# Patient Record
Sex: Female | Born: 1965 | Race: White | Hispanic: No | Marital: Married | Smoking: Former smoker
Health system: Southern US, Community
[De-identification: ages and names within clinical notes are randomized; demographics above are authoritative.]

## PROBLEM LIST (undated history)

## (undated) DIAGNOSIS — M549 Dorsalgia, unspecified: Secondary | ICD-10-CM

## (undated) DIAGNOSIS — F909 Attention-deficit hyperactivity disorder, unspecified type: Secondary | ICD-10-CM

## (undated) DIAGNOSIS — G2581 Restless legs syndrome: Secondary | ICD-10-CM

## (undated) HISTORY — PX: CHOLECYSTECTOMY: SHX55

## (undated) HISTORY — PX: ABLATION: SHX5711

## (undated) HISTORY — PX: NASAL FRACTURE SURGERY: SHX718

## (undated) HISTORY — PX: TUBAL LIGATION: SHX77

---

## 2007-01-27 ENCOUNTER — Ambulatory Visit: Payer: Self-pay | Admitting: Family Medicine

## 2007-01-27 DIAGNOSIS — L2089 Other atopic dermatitis: Secondary | ICD-10-CM | POA: Insufficient documentation

## 2007-01-27 DIAGNOSIS — G4700 Insomnia, unspecified: Secondary | ICD-10-CM | POA: Insufficient documentation

## 2007-01-27 DIAGNOSIS — K219 Gastro-esophageal reflux disease without esophagitis: Secondary | ICD-10-CM | POA: Insufficient documentation

## 2007-01-28 ENCOUNTER — Encounter: Payer: Self-pay | Admitting: Family Medicine

## 2007-02-02 LAB — CONVERTED CEMR LAB
ALT: 15 U/L
AST: 15 U/L
Albumin: 4.6 g/dL
Alkaline Phosphatase: 81 U/L
BUN: 11 mg/dL
CO2: 21 meq/L
Calcium: 9.1 mg/dL
Chloride: 105 meq/L
Cholesterol: 172 mg/dL
Creatinine, Ser: 0.76 mg/dL
Glucose, Bld: 85 mg/dL
HDL: 51 mg/dL
Helicobacter pylori, IgM: <0.89
LDL Cholesterol: 99 mg/dL
Potassium: 4.2 meq/L
Sodium: 139 meq/L
Total Bilirubin: 0.4 mg/dL
Total CHOL/HDL Ratio: 3.4
Total Protein: 7.5 g/dL
Triglycerides: 110 mg/dL
VLDL: 22 mg/dL

## 2007-12-07 ENCOUNTER — Ambulatory Visit: Payer: Self-pay | Admitting: Family

## 2007-12-07 ENCOUNTER — Encounter: Admission: RE | Admit: 2007-12-07 | Discharge: 2007-12-07 | Payer: Self-pay | Admitting: Obstetrics & Gynecology

## 2007-12-09 ENCOUNTER — Encounter: Admission: RE | Admit: 2007-12-09 | Discharge: 2007-12-09 | Payer: Self-pay | Admitting: Obstetrics & Gynecology

## 2007-12-11 ENCOUNTER — Encounter: Admission: RE | Admit: 2007-12-11 | Discharge: 2007-12-11 | Payer: Self-pay | Admitting: Obstetrics & Gynecology

## 2007-12-14 ENCOUNTER — Ambulatory Visit: Payer: Self-pay | Admitting: Family Medicine

## 2007-12-14 DIAGNOSIS — G2581 Restless legs syndrome: Secondary | ICD-10-CM | POA: Insufficient documentation

## 2007-12-14 DIAGNOSIS — L738 Other specified follicular disorders: Secondary | ICD-10-CM | POA: Insufficient documentation

## 2007-12-15 LAB — CONVERTED CEMR LAB
Ferritin: 10 ng/mL (ref 10–291)
Folate: 10.8 ng/mL
Vitamin B-12: 334 pg/mL (ref 211–911)

## 2007-12-16 ENCOUNTER — Ambulatory Visit: Payer: Self-pay | Admitting: Obstetrics & Gynecology

## 2007-12-23 ENCOUNTER — Ambulatory Visit: Payer: Self-pay | Admitting: Obstetrics & Gynecology

## 2008-01-18 ENCOUNTER — Ambulatory Visit: Payer: Self-pay | Admitting: Obstetrics & Gynecology

## 2008-01-18 ENCOUNTER — Encounter: Payer: Self-pay | Admitting: Obstetrics & Gynecology

## 2008-01-18 ENCOUNTER — Ambulatory Visit (HOSPITAL_COMMUNITY): Admission: RE | Admit: 2008-01-18 | Discharge: 2008-01-18 | Payer: Self-pay | Admitting: Obstetrics & Gynecology

## 2008-02-15 ENCOUNTER — Telehealth: Payer: Self-pay | Admitting: Family Medicine

## 2008-02-17 ENCOUNTER — Ambulatory Visit: Payer: Self-pay | Admitting: Obstetrics & Gynecology

## 2008-05-17 ENCOUNTER — Ambulatory Visit: Payer: Self-pay | Admitting: Occupational Medicine

## 2008-05-17 ENCOUNTER — Telehealth (INDEPENDENT_AMBULATORY_CARE_PROVIDER_SITE_OTHER): Payer: Self-pay | Admitting: Occupational Medicine

## 2008-09-14 ENCOUNTER — Ambulatory Visit: Payer: Self-pay | Admitting: Family Medicine

## 2009-03-27 ENCOUNTER — Ambulatory Visit: Payer: Self-pay | Admitting: Family Medicine

## 2009-03-27 DIAGNOSIS — N63 Unspecified lump in unspecified breast: Secondary | ICD-10-CM | POA: Insufficient documentation

## 2009-03-30 ENCOUNTER — Ambulatory Visit: Payer: Self-pay | Admitting: Family Medicine

## 2009-03-30 DIAGNOSIS — F909 Attention-deficit hyperactivity disorder, unspecified type: Secondary | ICD-10-CM | POA: Insufficient documentation

## 2009-04-06 ENCOUNTER — Encounter: Admission: RE | Admit: 2009-04-06 | Discharge: 2009-04-06 | Payer: Self-pay | Admitting: Family Medicine

## 2009-05-03 ENCOUNTER — Telehealth: Payer: Self-pay | Admitting: Family Medicine

## 2009-11-16 ENCOUNTER — Encounter: Admission: RE | Admit: 2009-11-16 | Discharge: 2009-11-16 | Payer: Self-pay | Admitting: Family Medicine

## 2011-01-01 NOTE — Op Note (Signed)
NAMELAVETA, GILKEY                ACCOUNT NO.:  [id number]   MEDICAL RECORD NO.:  [id number]          PATIENT TYPE:  AMB   LOCATION:                                FACILITY:  [facility]   PHYSICIAN:  Lesly Dukes, M.D. DATE OF BIRTH:  [birth year]   DATE OF PROCEDURE:  DATE OF DISCHARGE:                               OPERATIVE REPORT   PREOPERATIVE DIAGNOSES:  A 45 year old para 3-0-0-3 done with  childbearing with dysfunctional uterine bleeding and an endometrial  polyp.   POSTOPERATIVE DIAGNOSES:  A 45 year old para 3-0-0-3 done with  childbearing with dysfunctional uterine bleeding and an endometrial  polyp.   PROCEDURES:  1. Dilation and curettage.  2. Hysteroscopy.  3. Polypectomy.  4. Hydrothermal ablation.   SURGEON:  Lesly Dukes, MD.   ANESTHESIA:  General.   SPECIMENS:  Endometrial curettings to Pathology.   ESTIMATED BLOOD LOSS:  Minimal.   COMPLICATIONS:  None.   DESCRIPTION OF PROCEDURE:  After informed consent was obtained, the  patient was taken to the operating room where general anesthesia was  found to be adequate.  The patient was placed in dorsal lithotomy  position, and prepared and draped in normal sterile fashion.  The  bladder was in and out catheterized.  The uterus was then brought into  view with a bivalve speculum.  The anterior lip of the cervix was  grasped with single-tooth tenaculum.  A cervical os was gently dilated  to a #8 Hegar dilator.  The hysteroscope was introduced into the uterus  and soaked with saline.  There were several polyps and these were  scraped out using a sharp curette.  These curettings were sent to  Pathology.  At this point, the hydrothermal endometrial ablation was  then proceeded according to manufacture's guidelines.  There was a  heating phase and then a 10 minutes' ablation and a 1 minute cool down.  There was no evidence of leakage of fluid from the cervix and all fluid  level stayed stable.  At the end of  the procedure, all  instruments were removed from the patient's vagina and with good  hemostasis in the cervix.  The patient tolerated the procedure well.  Sponge, lap, instrument, and needle counts were correct x2.  The patient  to recovery room in stable condition.      Lesly Dukes, M.D.  Electronically Signed     KHL/MEDQ  D:  01/18/2008  T:  01/19/2008  Job:  [id number]
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
## 2011-01-01 NOTE — Assessment & Plan Note (Signed)
NAMEJEMIMA, Kelli Tyler                ACCOUNT NO.:  [id number]   MEDICAL RECORD NO.:  [id number]          PATIENT TYPE:  POB   LOCATION:  [facility] [place]         FACILITY:  [facility]   PHYSICIAN:  Elsie Lincoln, MD      DATE OF BIRTH:  [birth year]   DATE OF SERVICE:                                  CLINIC NOTE   REFERRING PHYSICIAN:  Elsie Lincoln, M.D.   Patient 45 year old female with dysfunction uterine bleeding.  Patient  had an ultrasound that showed possible adenomyosis.  She then underwent  endometrial biopsy which showed__________ proliferative endometrium and  endometrial polyps.  No hyperplasia or cancer was identified.  We had an  indepth conversation about what a polyp is, what adenomyosis is.  I  doubt she has adenomyosis because she absolutely no pain with her  periods.  So, this might be a polyp causing her bleeding.  We talked  about options and the best one we derived was a D and C hysteroscopy  polypectomy and hydrotherm ablation.  Patient understands she cannot get  pregnant after this procedure.  She has already had a BTL.  The patient  was consented for the procedure.  Risks include but not limited to  bleeding, infection and damage to the back of the uterus.  She also  could have burning at the vagina if the hot water leaked out during the  hydrothermal ablation.  I reviewed her history in depth from her clinic  note on April 20th.  I did notice she has an extensive history of colon  cancer and had a colonoscopy in 2001.  It was so painful that she has  elected not to have another one.  I am referring her to my  gastroenterologist at [facility] and hopefully he will be able  to do a less painful colonoscopy on her because it sounds like she is in  need one.  Also, she had a left breast mass and had a diagnostic  mammogram and ultrasound done and per the patient it was negative but we  need to follow up results of that.  The patient would like the surgery  scheduled on a Monday and we will try to arrange that.  We will give the  patient Provera daily until the procedure so hopefully she will not be  bleeding at the time of the procedure.  Also given her a refill on her  Tandem iron.           ______________________________  Elsie Lincoln, MD     KL/MEDQ  D:  12/23/2007  T:  12/23/2007  Job:  [id number]

## 2011-01-01 NOTE — Assessment & Plan Note (Signed)
NAMEMAKAYLI, Tyler                ACCOUNT NO.:  [id number]   MEDICAL RECORD NO.:  [id number]          PATIENT TYPE:  POB   LOCATION:  [facility] [place]         FACILITY:  [facility]   PHYSICIAN:  Elsie Lincoln, MD      DATE OF BIRTH:  20-Jan-1966   DATE OF SERVICE:  02/17/2008                                  CLINIC NOTE   HISTORY OF PRESENT ILLNESS:  The patient is a 45 year old female who had  dysfunctional uterine bleeding and endometrial polyp from biopsy.  The  patient underwent D&C, hysteroscopy, polypectomy, and endometrial  ablation on January 18, 2008.  The patient had a small watery period at the  end of June, and has had no other problems.  She did have some cramping  immediately after the procedure and cramping with period.  Overall, it  was much lighter.  The patient is due for a Pap smear in June 2009.  We  will schedule her next couple of months to get her health care  maintenance exam done.  The patient was also referred to  gastroenterologist, Dr. Stan Head for a colonoscopy because her  father has a history of colon cancer.  She had an unpleasant colonoscopy  in 2002, and is hesitant to return.  I encouraged to go ahead and make  an appointment given that colon cancer is a bad option to get in by  avoiding the colonoscopy.  The patient is complaining of a headache for  the past couple weeks.  She has taken Tylenol and Goodys.  I suggested  she take some Motrin 800 mg every 8 hours or naproxen sodium 2 tabs  every 12 hours to help with the pain.  She is sleeping well with Tylenol  PM.  The patient also had restless legs syndrome and will be starting on  a medication for that.  The patient encouraged to get her primary care  doctor to pursue any more complaints of headache.   PHYSICAL EXAMINATION:  VITAL SIGNS:  Pulse 64, blood pressure 110/70,  weight 180, and height 66 inches on physical exam done today.           ______________________________  Elsie Lincoln,  MD     KL/MEDQ  D:  02/17/2008  T:  02/17/2008  Job:  [id number]

## 2011-05-16 LAB — CBC
HCT: 39.7
Hemoglobin: 13.6
MCV: 87.4
RBC: 4.55

## 2013-08-09 ENCOUNTER — Encounter: Payer: Self-pay | Admitting: Emergency Medicine

## 2013-08-09 ENCOUNTER — Emergency Department
Admission: EM | Admit: 2013-08-09 | Discharge: 2013-08-09 | Disposition: A | Discharge status: Home / Self Care | Payer: BC Managed Care – PPO | Source: Home / Self Care | Attending: Family Medicine | Admitting: Family Medicine

## 2013-08-09 DIAGNOSIS — R197 Diarrhea, unspecified: Secondary | ICD-10-CM

## 2013-08-09 DIAGNOSIS — J209 Acute bronchitis, unspecified: Secondary | ICD-10-CM

## 2013-08-09 DIAGNOSIS — K529 Noninfective gastroenteritis and colitis, unspecified: Secondary | ICD-10-CM

## 2013-08-09 MED ORDER — AZITHROMYCIN 250 MG PO TABS: ORAL_TABLET | ORAL | Status: DC

## 2013-08-09 MED ORDER — BENZONATATE 200 MG PO CAPS: 200.0000 mg | ORAL_CAPSULE | Freq: Every day | ORAL | Status: DC

## 2013-08-09 NOTE — ED Notes (Signed)
Pt c/o cough, runny nose, and diarrhea x 4 days. She is unsure if she has had a fever.

## 2013-08-09 NOTE — ED Provider Notes (Signed)
CSN: [id number]     Arrival date & time 08/09/13  1617 History   First MD Initiated Contact with Patient 08/09/13 1655     Chief Complaint  Patient presents with  . Cough  . Nasal Congestion  . Diarrhea      HPI Comments: Patient complains of 3 day history of nasal congestion and non-productive cough.  She has felt hot.  She has not had a sore throat and denies myalgias or headache. She states that she has had chronic diarrhea for about two months without abdominal pain or blood in stool.  Denies recent foreign travel, or drinking untreated water in a wilderness environment.  Prior to onset of diarrhea she had had persistent constipation. She reports that her father had had colon CA.  The history is provided by the patient.    History reviewed. No pertinent past medical history. Past Surgical History  Procedure Laterality Date  . Cholecystectomy    . Tubal ligation    . Nasal fracture surgery    . Ablation     Family History  Problem Relation Age of Onset  . Aneurysm Mother   . Stroke Mother   . Cancer Father     colon   History  Substance Use Topics  . Smoking status: Current Every Day Smoker -- 1.00 packs/day  . Smokeless tobacco: Not on file  . Alcohol Use: Yes   OB History   Grav Para Term Preterm Abortions TAB SAB Ect Mult Living                 Review of Systems No sore throat + cough No pleuritic pain No wheezing + nasal congestion + post-nasal drainage No sinus pain/pressure No itchy/red eyes No earache No hemoptysis No SOB ? fever, + chills No nausea No vomiting No abdominal pain + diarrhea No urinary symptoms No skin rash + fatigue No myalgias + headache Used OTC meds without relief  Allergies  Review of patient's allergies indicates no known allergies.  Home Medications   Current Outpatient Rx  Name  Route  Sig  Dispense  Refill  . traMADol (ULTRAM) 50 MG tablet   Oral   Take by mouth every 6 (six) hours as needed.         Marland Kitchen  azithromycin (ZITHROMAX Z-PAK) 250 MG tablet      Take 2 tabs today; then begin one tab once daily for 4 more days.   6 each   0   . benzonatate (TESSALON) 200 MG capsule   Oral   Take 1 capsule (200 mg total) by mouth at bedtime. Take as needed for cough   12 capsule   0    BP 132/86  Pulse 81  Temp(Src) 98.1 F (36.7 C) (Oral)  Resp 18  Ht 5\' 7"  (1.702 m)  Wt 178 lb (80.74 kg)  BMI 27.87 kg/m2  SpO2 97%  LMP 08/09/2013 Physical Exam Nursing notes and Vital Signs reviewed. Appearance:  Patient appears healthy, stated age, and in no acute distress Eyes:  Pupils are equal, round, and reactive to light and accomodation.  Extraocular movement is intact.  Conjunctivae are not inflamed  Ears:  Canals normal.  Tympanic membranes normal.  Nose:  Mildly congested turbinates.  No sinus tenderness.  Pharynx:  Normal Neck:  Supple.   No adenopathy Lungs:  Clear to auscultation.  Breath sounds are equal. Chest:  Distinct tenderness to palpation over the mid-sternum.   Heart:  Regular rate and rhythm without  murmurs, rubs, or gallops.  Abdomen:  Nontender without masses or hepatosplenomegaly.  Bowel sounds are present.  No CVA or flank tenderness.  Extremities:  No edema.  No calf tenderness Skin:  No rash present.   ED Course  Procedures  none       MDM   1. Acute bronchitis   2. Chronic diarrhea    Begin z-pack to cover atypical agents.  Prescription written for Benzonatate [facility]) to take at bedtime for night-time cough.  Take plain Mucinex (1200 mg guaifenesin) twice daily for cough and congestion.  Increase fluid intake, rest. May use Afrin nasal spray (or generic oxymetazoline) twice daily for about 5 days.  Also recommend using saline nasal spray several times daily and saline nasal irrigation (AYR is a common brand) Stop all antihistamines for now, and other non-prescription cough/cold preparations. Follow-up with family doctor if not improving 7 to 10  days. Followup with PCP for evaluation of chronic diarrhea     Lattie Haw, MD 08/09/13 [phone]

## 2013-08-30 ENCOUNTER — Other Ambulatory Visit: Payer: Self-pay | Admitting: *Deleted

## 2013-08-30 ENCOUNTER — Ambulatory Visit (INDEPENDENT_AMBULATORY_CARE_PROVIDER_SITE_OTHER): Payer: BC Managed Care – PPO | Admitting: Family Medicine

## 2013-08-30 ENCOUNTER — Encounter: Payer: Self-pay | Admitting: Family Medicine

## 2013-08-30 ENCOUNTER — Other Ambulatory Visit (HOSPITAL_COMMUNITY)
Admission: RE | Admit: 2013-08-30 | Discharge: 2013-08-30 | Disposition: A | Discharge status: Home / Self Care | Payer: BC Managed Care – PPO | Source: Ambulatory Visit | Attending: Family Medicine | Admitting: Family Medicine

## 2013-08-30 VITALS — BP 127/76 | HR 77 | Temp 97.8°F | Ht 67.0 in | Wt 180.0 lb

## 2013-08-30 DIAGNOSIS — F17200 Nicotine dependence, unspecified, uncomplicated: Secondary | ICD-10-CM

## 2013-08-30 DIAGNOSIS — Z720 Tobacco use: Secondary | ICD-10-CM

## 2013-08-30 DIAGNOSIS — Z0000 Encounter for general adult medical examination without abnormal findings: Secondary | ICD-10-CM

## 2013-08-30 DIAGNOSIS — Z1231 Encounter for screening mammogram for malignant neoplasm of breast: Secondary | ICD-10-CM

## 2013-08-30 DIAGNOSIS — F909 Attention-deficit hyperactivity disorder, unspecified type: Secondary | ICD-10-CM

## 2013-08-30 DIAGNOSIS — Z01419 Encounter for gynecological examination (general) (routine) without abnormal findings: Secondary | ICD-10-CM | POA: Insufficient documentation

## 2013-08-30 DIAGNOSIS — Z800 Family history of malignant neoplasm of digestive organs: Secondary | ICD-10-CM

## 2013-08-30 DIAGNOSIS — Z124 Encounter for screening for malignant neoplasm of cervix: Secondary | ICD-10-CM

## 2013-08-30 DIAGNOSIS — Z1151 Encounter for screening for human papillomavirus (HPV): Secondary | ICD-10-CM | POA: Insufficient documentation

## 2013-08-30 DIAGNOSIS — Z23 Encounter for immunization: Secondary | ICD-10-CM

## 2013-08-30 LAB — LIPID PANEL
CHOL/HDL RATIO: 3 ratio
Cholesterol: 163 mg/dL (ref 0–200)
HDL: 55 mg/dL (ref >39)
LDL CALC: 90 mg/dL (ref 0–99)
TRIGLYCERIDES: 91 mg/dL (ref <150)
VLDL: 18 mg/dL (ref 0–40)

## 2013-08-30 LAB — COMPLETE METABOLIC PANEL WITH GFR
ALT: 17 U/L (ref 0–35)
AST: 17 U/L (ref 0–37)
Albumin: 4.4 g/dL (ref 3.5–5.2)
Alkaline Phosphatase: 70 U/L (ref 39–117)
BUN: 10 mg/dL (ref 6–23)
CO2: 26 meq/L (ref 19–32)
Calcium: 9.5 mg/dL (ref 8.4–10.5)
Chloride: 105 mEq/L (ref 96–112)
Creat: 0.6 mg/dL (ref 0.50–1.10)
GFR, Est African American: >89 mL/min
GFR, Est Non African American: >89 mL/min
GLUCOSE: 93 mg/dL (ref 70–99)
Potassium: 4.5 mEq/L (ref 3.5–5.3)
SODIUM: 138 meq/L (ref 135–145)
TOTAL PROTEIN: 6.9 g/dL (ref 6.0–8.3)
Total Bilirubin: 0.4 mg/dL (ref 0.3–1.2)

## 2013-08-30 MED ORDER — TRAMADOL HCL 50 MG PO TABS: 50.0000 mg | ORAL_TABLET | Freq: Four times a day (QID) | ORAL | Status: DC | PRN

## 2013-08-30 MED ORDER — AMPHETAMINE-DEXTROAMPHET ER 10 MG PO CP24: 10.0000 mg | ORAL_CAPSULE | Freq: Every day | ORAL | Status: DC

## 2013-08-30 MED ORDER — VARENICLINE TARTRATE 0.5 MG X 11 & 1 MG X 42 PO MISC: ORAL | Status: DC

## 2013-08-30 NOTE — Patient Instructions (Signed)
Keep up a regular exercise program and make sure you are eating a healthy diet Try to eat 4 servings of dairy a day, or if you are lactose intolerant take a calcium with vitamin D daily.  Your vaccines are up to date.   

## 2013-08-30 NOTE — Progress Notes (Signed)
  Subjective:     Kelli Tyler is a 48 y.o. female and is here for a comprehensive physical exam. The patient reports problems - wants to quit smoking. Has tried the patches.  History   Social History  . Marital Status: Married    Spouse Name: N/A    Number of Children: N/A  . Years of Education: N/A   Occupational History  . Not on file.   Social History Main Topics  . Smoking status: Current Every Day Smoker -- 1.00 packs/day  . Smokeless tobacco: Not on file  . Alcohol Use: Yes  . Drug Use: No  . Sexual Activity: Not on file   Other Topics Concern  . Not on file   Social History Narrative  . No narrative on file   Health Maintenance  Topic Date Due  . Pap Smear  07/18/1986  . Colonoscopy  07/18/1986  . Tetanus/tdap  07/19/1987  . Influenza Vaccine  04/30/2014    The following portions of the patient's history were reviewed and updated as appropriate: allergies, current medications, past family history, past medical history, past social history, past surgical history and problem list.  Review of Systems A comprehensive review of systems was negative.   Objective:    BP 127/76  Pulse 77  Temp(Src) 97.8 F (36.6 C)  Ht 5\' 7"  (1.702 m)  Wt 180 lb (81.647 kg)  BMI 28.19 kg/m2  LMP 08/09/2013 General appearance: alert, cooperative and appears stated age Head: Normocephalic, without obvious abnormality, atraumatic Eyes: conj clear, EOMi, PEERLA Ears: normal TM's and external ear canals both ears Nose: Nares normal. Septum midline. Mucosa normal. No drainage or sinus tenderness. Throat: lips, mucosa, and tongue normal; teeth and gums normal Neck: no adenopathy, no carotid bruit, no JVD, supple, symmetrical, trachea midline and thyroid not enlarged, symmetric, no tenderness/mass/nodules Back: symmetric, no curvature. ROM normal. No CVA tenderness. Lungs: clear to auscultation bilaterally Breasts: normal appearance, no masses or tenderness Heart: regular rate and  rhythm, S1, S2 normal, no murmur, click, rub or gallop Abdomen: soft, non-tender; bowel sounds normal; no masses,  no organomegaly Pelvic: cervix normal in appearance, external genitalia normal, no adnexal masses or tenderness, no cervical motion tenderness, rectovaginal septum normal, uterus normal size, shape, and consistency and vagina normal without discharge Extremities: extremities normal, atraumatic, no cyanosis or edema Pulses: 2+ and symmetric Skin: Skin color, texture, turgor normal. No rashes or lesions Lymph nodes: Cervical, supraclavicular, and axillary nodes normal. Neurologic: Alert and oriented X 3, normal strength and tone. Normal symmetric reflexes. Normal coordination and gait    Assessment:    Healthy female exam.       Plan:  Keep up a regular exercise program and make sure you are eating a healthy diet Try to eat 4 servings of dairy a day, or if you are lactose intolerant take a calcium with vitamin D daily.  Your vaccines are up to date.   Screening mammogram-last mammogram was in 2009. Will place order today.  Due for repeat colonoscopy. She has a family history of colon cancer in her father. Will schedule.  Tobacco abuse-she is interested in quitting he would like to try Chantix.   ADHD - will restart Adderall. Looked in old EMR systerm. She was on [name] 10mg  daily. F/U in 6 months.    See After Visit Summary for Counseling Recommendations

## 2013-08-31 NOTE — Progress Notes (Signed)
Quick Note:  All labs are normal. ______ 

## 2013-09-07 ENCOUNTER — Ambulatory Visit (INDEPENDENT_AMBULATORY_CARE_PROVIDER_SITE_OTHER): Payer: BC Managed Care – PPO

## 2013-09-07 DIAGNOSIS — Z1231 Encounter for screening mammogram for malignant neoplasm of breast: Secondary | ICD-10-CM

## 2013-10-08 ENCOUNTER — Other Ambulatory Visit: Payer: Self-pay | Admitting: Family Medicine

## 2013-10-27 ENCOUNTER — Encounter: Payer: Self-pay | Admitting: Family Medicine

## 2013-11-19 ENCOUNTER — Other Ambulatory Visit: Payer: Self-pay | Admitting: Physician Assistant

## 2013-11-30 ENCOUNTER — Other Ambulatory Visit: Payer: Self-pay | Admitting: *Deleted

## 2013-11-30 MED ORDER — AMPHETAMINE-DEXTROAMPHET ER 10 MG PO CP24: 10.0000 mg | ORAL_CAPSULE | Freq: Every day | ORAL | Status: DC

## 2013-12-01 ENCOUNTER — Telehealth: Payer: Self-pay | Admitting: Family Medicine

## 2013-12-01 NOTE — Telephone Encounter (Signed)
Patient states that it has been so long since she tried the Chantix, she needs a script for the starter kit and also the next step after the starter kit. Please call patient back ans let her know the status. Thanks

## 2013-12-02 MED ORDER — VARENICLINE TARTRATE 0.5 MG X 11 & 1 MG X 42 PO MISC: ORAL | Status: DC

## 2013-12-02 NOTE — Telephone Encounter (Signed)
Chantix sent

## 2013-12-02 NOTE — Telephone Encounter (Signed)
Okay to send Chantix starter kit.

## 2014-01-06 ENCOUNTER — Other Ambulatory Visit: Payer: Self-pay | Admitting: Family Medicine

## 2014-02-08 ENCOUNTER — Other Ambulatory Visit: Payer: Self-pay | Admitting: Family Medicine

## 2014-02-11 ENCOUNTER — Telehealth: Payer: Self-pay | Admitting: *Deleted

## 2014-02-11 NOTE — Telephone Encounter (Signed)
Pt called and states rx for tramadol was never received at the pharm. Called in rx to [facility]

## 2014-02-11 NOTE — Telephone Encounter (Signed)
Correction from previous phone note; the pharmacy did receive a rx on 5/21 per pharmacy tech who just called. Pt was requesting a refill on tramadol. I don't see in her chart why she received a rx for this. I advised pharmacy tech that she does need an appointment

## 2014-02-12 ENCOUNTER — Other Ambulatory Visit: Payer: Self-pay | Admitting: Family Medicine

## 2014-03-14 ENCOUNTER — Ambulatory Visit (INDEPENDENT_AMBULATORY_CARE_PROVIDER_SITE_OTHER): Payer: BC Managed Care – PPO | Admitting: Family Medicine

## 2014-03-14 ENCOUNTER — Encounter: Payer: Self-pay | Admitting: Family Medicine

## 2014-03-14 VITALS — BP 138/86 | HR 86 | Ht 67.0 in | Wt 180.0 lb

## 2014-03-14 DIAGNOSIS — M549 Dorsalgia, unspecified: Secondary | ICD-10-CM

## 2014-03-14 DIAGNOSIS — H9312 Tinnitus, left ear: Secondary | ICD-10-CM

## 2014-03-14 DIAGNOSIS — H9319 Tinnitus, unspecified ear: Secondary | ICD-10-CM

## 2014-03-14 DIAGNOSIS — F909 Attention-deficit hyperactivity disorder, unspecified type: Secondary | ICD-10-CM

## 2014-03-14 DIAGNOSIS — G8929 Other chronic pain: Secondary | ICD-10-CM

## 2014-03-14 MED ORDER — TRAMADOL HCL 50 MG PO TABS: ORAL_TABLET | ORAL | Status: DC

## 2014-03-14 MED ORDER — AMPHETAMINE-DEXTROAMPHET ER 15 MG PO CP24: 15.0000 mg | ORAL_CAPSULE | Freq: Every day | ORAL | Status: DC

## 2014-03-14 NOTE — Progress Notes (Signed)
Subjective:    Patient ID: Kelli Tyler, female    DOB: [birth year], 48 y.o.   MRN: [id number]  HPI F/u ADHD - Says feels needs to incrase the medication.  Says when first started taking it she felt more organized.  No palpitaion, CP or SOB.   Not keeping her awake at night.    Chronic upper back pain - would ike a refill on her tramadol. 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    Hearing a squeaking in her left ear.  She says she had a lot of pain in both ears when she flew. But that has resolved but she keeps hearing any extra noise in the left ear. She notices it more with taking a deep breath in. But no more pain. No fever chills or sweats.   Review of Systems  BP 138/86  Pulse 86  Ht 5\' 7"  (1.702 m)  Wt 180 lb (81.647 kg)  BMI 28.19 kg/m2    No Known Allergies  No past medical history on file.  Past Surgical History  Procedure Laterality Date  . Cholecystectomy    . Tubal ligation    . Nasal fracture surgery    . Ablation      History   Social History  . Marital Status: Married    Spouse Name: N/A    Number of Children: N/A  . Years of Education: N/A   Occupational History  . Not on file.   Social History Main Topics  . Smoking status: Current Every Day Smoker -- 1.00 packs/day  . Smokeless tobacco: Not on file  . Alcohol Use: Yes  . Drug Use: No  . Sexual Activity: Not on file   Other Topics Concern  . Not on file   Social History Narrative  . No narrative on file    Family History  Problem Relation Age of Onset  . Aneurysm Mother   . Stroke Mother   . Cancer Father     colon    Outpatient Encounter Prescriptions as of 03/14/2014  Medication Sig  . amphetamine-dextroamphetamine ([name]) 15 MG 24 hr capsule Take 1 capsule (15 mg total) by mouth daily.  . traMADol (ULTRAM) 50 MG tablet TAKE ONE TABLET BY MOUTH EVERY 6 HOURS AS NEEDED  . varenicline (CHANTIX STARTING MONTH PAK) 0.5 MG X 11 & 1 MG X 42 tablet Take one 0.5 mg tablet by mouth  once daily for 3 days, then increase to one 0.5 mg tablet twice daily for 4 days, then increase to one 1 mg tablet twice daily.  . [DISCONTINUED] amphetamine-dextroamphetamine ([name]) 10 MG 24 hr capsule Take 1 capsule (10 mg total) by mouth daily.  . [DISCONTINUED] traMADol (ULTRAM) 50 MG tablet TAKE ONE TABLET BY MOUTH EVERY 6 HOURS AS NEEDED          Objective:   Physical Exam  Constitutional: She is oriented to person, place, and time. She appears well-developed and well-nourished.  HENT:  Head: Normocephalic and atraumatic.  Right Ear: External ear normal.  Left Ear: External ear normal.  Nose: Nose normal.  Mouth/Throat: Oropharynx is clear and moist.  Tympanic membrane are normal bilaterally. She does have a hair from her scalp line in the left ear canal. Difficult to tell if it's in front of the tympanic membrane are actually lying up against the tympanic membrane.  Cardiovascular: Normal rate, regular rhythm and normal heart sounds.   Pulmonary/Chest: Effort normal and breath sounds normal.  Musculoskeletal:  Neck with  normal flexion constant and, rotation right and left and side bending right and left.  Neurological: She is alert and oriented to person, place, and time.  Skin: Skin is warm and dry.  Psychiatric: She has a normal mood and affect. Her behavior is normal.          Assessment & Plan:  ADHD- Doing well but will increase Adderall dose to 15mg .  followup in 4 months. Only filled for one perception today. She can call to let us know if it's working well. If it is then we can go ahead and put the next 3 months until her followup in 4 months.  Chronic upper back/neck pain-I. did refill her tramadol. Also given a handout with additional stretches etc. to do on her own at home.  Left ear noise - exam is fairly normal except there is a long hair actually lying in the ear canal. This certainly could be rubbing or scratching against the tympanic membrane  creating extra noise. Offered to irrigate the ear but she declined and said she will try to do it herself at home.

## 2014-04-05 ENCOUNTER — Telehealth: Payer: Self-pay | Admitting: *Deleted

## 2014-04-05 MED ORDER — AMPHETAMINE-DEXTROAMPHET ER 10 MG PO CP24: 10.0000 mg | ORAL_CAPSULE | Freq: Every day | ORAL | Status: DC

## 2014-04-05 MED ORDER — AMPHETAMINE-DEXTROAMPHETAMINE 5 MG PO TABS: 5.0000 mg | ORAL_TABLET | Freq: Every day | ORAL | Status: DC

## 2014-04-05 NOTE — Telephone Encounter (Signed)
Pt called and stated that the adderll 15 xr makes her tired and would like to do the 10 mg  xr and 5 mg for later in the day.

## 2014-04-05 NOTE — Telephone Encounter (Signed)
Okay. Sounds good. She can pick up prescriptions up later this week.

## 2014-04-22 ENCOUNTER — Telehealth: Payer: Self-pay | Admitting: *Deleted

## 2014-04-22 NOTE — Telephone Encounter (Signed)
Pt called and stated that she is doing very well on the new adderall rx and would like to continue wanted to inform Dr. Linford Arnold.Kelli Tyler [place]

## 2014-04-22 NOTE — Telephone Encounter (Signed)
Ok to refill for next 3 months and i will sign. Then will need to f/u towards end of last RX.

## 2014-04-26 ENCOUNTER — Other Ambulatory Visit: Payer: Self-pay | Admitting: *Deleted

## 2014-04-26 MED ORDER — AMPHETAMINE-DEXTROAMPHET ER 10 MG PO CP24: 10.0000 mg | ORAL_CAPSULE | Freq: Every day | ORAL | Status: DC

## 2014-04-26 MED ORDER — AMPHETAMINE-DEXTROAMPHETAMINE 5 MG PO TABS: 5.0000 mg | ORAL_TABLET | Freq: Every day | ORAL | Status: DC

## 2014-04-26 NOTE — Telephone Encounter (Signed)
rx printed.Kelli Tyler  

## 2014-05-02 ENCOUNTER — Ambulatory Visit (INDEPENDENT_AMBULATORY_CARE_PROVIDER_SITE_OTHER): Payer: Self-pay | Admitting: Family Medicine

## 2014-05-02 ENCOUNTER — Encounter: Payer: Self-pay | Admitting: Family Medicine

## 2014-05-02 VITALS — BP 111/68 | HR 85 | Wt 170.0 lb

## 2014-05-02 DIAGNOSIS — T148XXA Other injury of unspecified body region, initial encounter: Secondary | ICD-10-CM

## 2014-05-02 DIAGNOSIS — L02219 Cutaneous abscess of trunk, unspecified: Secondary | ICD-10-CM

## 2014-05-02 DIAGNOSIS — L03319 Cellulitis of trunk, unspecified: Principal | ICD-10-CM

## 2014-05-02 DIAGNOSIS — W540XXA Bitten by dog, initial encounter: Secondary | ICD-10-CM

## 2014-05-02 MED ORDER — AMOXICILLIN-POT CLAVULANATE 875-125 MG PO TABS: 1.0000 | ORAL_TABLET | Freq: Two times a day (BID) | ORAL | Status: DC

## 2014-05-02 MED ORDER — TRAMADOL HCL 50 MG PO TABS: ORAL_TABLET | ORAL | Status: DC

## 2014-05-02 NOTE — Patient Instructions (Signed)

## 2014-05-02 NOTE — Progress Notes (Signed)
   Subjective:    Patient ID: Kelli Tyler, female    DOB: [birth year], 48 y.o.   MRN: [id number]  HPI  14 day followup after a dog bite to her right leg. It was a bite from her own dog. She went to the local hospital and was treated. 32 sutures were placed. She's here to have those removed today. She denies any fever sweats or chills. She denies any drainage from the wounds.  She has an abscess under the right axilla.  She said she drinks wine about 2 weeks ago and thinks that that may have caused it. She's been using Campho-Phenique and alcohol on it. She does feel like it's actually gotten a little bit smaller and has been healing of the last few days. She has not had any drainage from the wound. Review of Systems     Objective:   Physical Exam  She has 2 deeper lacerations to the anterior part of the upper right leg. She has 2 smaller lacerations posteriorly. He appeared be healing fairly well. No active drainage. She does have some induration around the wound but no erythema. She also has a fair amount of bruising to the soft tissue.  Under the right axilla she has an approximately 1 cm area of mild erythema and tenderness. No surrounding or spreading erythema. It looks like it's actually healing and receding. There is a line of dry skin around the edge.     Assessment & Plan:  Sutures removed today. Patient tolerated very well. All removed. She still has a little bit of crepitus sitting to 2 of the lacerations on the right lower leg. For now encouraged her to apply Vaseline and a bandage for the next week. After that if it's healing well then she can start applying scar creams like Mederma. If she notices any change in hardness or induration or drainage from the wound then please let me know. Also call if any fevers chills or sweats. We'll call her in 5 more days of the Augmentin. She says she was postictal for 21 days but there was only about 15 days worth in her bottle.  Abscess-appears  to be healing on its own. Encouraged her to an TE with her antibiotic and to start doing warm compresses to help bring it to ahead and see if it will drain on its own. I offered to do an I&D today but she declined. Again call if she feels like there's any erythema spreading or fevers or chills.   Subjective:    Kelli Tyler is a 48 y.o. female who obtained a laceration 14 days ago, which required closure with 32 sutures. Mechanism of injury: Dog bite. She denies pain, redness, or drainage from the wound. Her last tetanus was 6 months ago.  Review of Systems Pertinent items are noted in HPI.    Objective:    BP 111/68  Pulse 85  Wt 170 lb (77.111 kg) Injury exam:  Seeabove.   Assessment:    Laceration is healing well, without evidence of infection.    Plan:     1. 32 sutures were removed. 2. Wound care discussed. 3. Follow up as needed.

## 2014-07-28 ENCOUNTER — Other Ambulatory Visit: Payer: Self-pay | Admitting: Family Medicine

## 2014-08-02 ENCOUNTER — Other Ambulatory Visit: Payer: Self-pay | Admitting: *Deleted

## 2014-08-02 MED ORDER — AMPHETAMINE-DEXTROAMPHETAMINE 5 MG PO TABS: 5.0000 mg | ORAL_TABLET | Freq: Every day | ORAL | Status: DC

## 2014-08-02 MED ORDER — AMPHETAMINE-DEXTROAMPHET ER 10 MG PO CP24: 10.0000 mg | ORAL_CAPSULE | Freq: Every day | ORAL | Status: DC

## 2014-08-02 NOTE — Telephone Encounter (Signed)
rx placed up front for p/u.Kelli PacasBarkley, Gaudencio Chesnut [place]

## 2014-09-05 ENCOUNTER — Other Ambulatory Visit: Payer: Self-pay | Admitting: *Deleted

## 2014-09-05 MED ORDER — AMPHETAMINE-DEXTROAMPHET ER 10 MG PO CP24: 10.0000 mg | ORAL_CAPSULE | Freq: Every day | ORAL | Status: DC

## 2014-09-05 MED ORDER — AMPHETAMINE-DEXTROAMPHETAMINE 5 MG PO TABS: 5.0000 mg | ORAL_TABLET | Freq: Every day | ORAL | Status: DC

## 2014-09-05 NOTE — Telephone Encounter (Signed)
Pt called and advised that she will need a f/u appt w/Dr. Linford ArnoldMetheney for adhd meds. appt made for 09/19/14 @ 115.Loralee PacasBarkley, Jabaree Mercado [place]

## 2014-09-19 ENCOUNTER — Ambulatory Visit (INDEPENDENT_AMBULATORY_CARE_PROVIDER_SITE_OTHER): Payer: Self-pay | Admitting: Family Medicine

## 2014-09-19 ENCOUNTER — Encounter: Payer: Self-pay | Admitting: Family Medicine

## 2014-09-19 VITALS — BP 120/66 | HR 82 | Wt 174.0 lb

## 2014-09-19 DIAGNOSIS — F902 Attention-deficit hyperactivity disorder, combined type: Secondary | ICD-10-CM

## 2014-09-19 DIAGNOSIS — G8929 Other chronic pain: Secondary | ICD-10-CM

## 2014-09-19 DIAGNOSIS — M546 Pain in thoracic spine: Secondary | ICD-10-CM

## 2014-09-19 DIAGNOSIS — M549 Dorsalgia, unspecified: Secondary | ICD-10-CM

## 2014-09-19 MED ORDER — AMPHETAMINE-DEXTROAMPHET ER 10 MG PO CP24: 10.0000 mg | ORAL_CAPSULE | Freq: Every day | ORAL | Status: DC

## 2014-09-19 MED ORDER — AMPHETAMINE-DEXTROAMPHETAMINE 5 MG PO TABS: 5.0000 mg | ORAL_TABLET | Freq: Every day | ORAL | Status: DC

## 2014-09-19 MED ORDER — TRAMADOL HCL 50 MG PO TABS: 50.0000 mg | ORAL_TABLET | Freq: Four times a day (QID) | ORAL | Status: DC | PRN

## 2014-09-19 NOTE — Progress Notes (Signed)
   Subjective:    Patient ID: Kelli Tyler, female    DOB: 02-15-1966, 49 y.o.   MRN: [id number]  HPI ADHD - No CP or SOB or palpitations. Taking the medication without any S.E or complications. Due for refills.    Chronic upper back pain-she needs a refill on her tramadol today. She has been seeing her chiropractor regularly and in fact has an appointment today.  Review of Systems     Objective:   Physical Exam  Constitutional: She is oriented to person, place, and time. She appears well-developed and well-nourished.  HENT:  Head: Normocephalic and atraumatic.  Cardiovascular: Normal rate, regular rhythm and normal heart sounds.   Pulmonary/Chest: Effort normal and breath sounds normal.  Neurological: She is alert and oriented to person, place, and time.  Skin: Skin is warm and dry.  Psychiatric: She has a normal mood and affect. Her behavior is normal.          Assessment & Plan:  ADHD - well controlled. Continue current regimen. F/U in 3 months.  Blood pressures well controlled.    Chronic upper back pain-refill the tramadol today. She can also take Tylenol with it if needed for extra pain relief. Just follow the instructions on the bottle. Continue with regular chiropractic care.

## 2014-10-17 ENCOUNTER — Encounter: Payer: Self-pay | Admitting: Family Medicine

## 2014-10-17 ENCOUNTER — Ambulatory Visit (INDEPENDENT_AMBULATORY_CARE_PROVIDER_SITE_OTHER): Payer: Self-pay | Admitting: Family Medicine

## 2014-10-17 VITALS — BP 128/75 | HR 83 | Ht 67.0 in | Wt 174.0 lb

## 2014-10-17 DIAGNOSIS — M2550 Pain in unspecified joint: Secondary | ICD-10-CM

## 2014-10-17 DIAGNOSIS — Z0000 Encounter for general adult medical examination without abnormal findings: Secondary | ICD-10-CM

## 2014-10-17 DIAGNOSIS — R5383 Other fatigue: Secondary | ICD-10-CM

## 2014-10-17 DIAGNOSIS — Z13220 Encounter for screening for lipoid disorders: Secondary | ICD-10-CM

## 2014-10-17 DIAGNOSIS — Z114 Encounter for screening for human immunodeficiency virus [HIV]: Secondary | ICD-10-CM

## 2014-10-17 DIAGNOSIS — Z1231 Encounter for screening mammogram for malignant neoplasm of breast: Secondary | ICD-10-CM

## 2014-10-17 LAB — LIPID PANEL
Cholesterol: 198 mg/dL (ref 0–200)
HDL: 55 mg/dL (ref >=46)
LDL Cholesterol: 123 mg/dL — ABNORMAL HIGH (ref 0–99)
TRIGLYCERIDES: 102 mg/dL (ref <150)
Total CHOL/HDL Ratio: 3.6 Ratio
VLDL: 20 mg/dL (ref 0–40)

## 2014-10-17 LAB — CBC
HCT: 44.4 % (ref 36.0–46.0)
HEMOGLOBIN: 14.8 g/dL (ref 12.0–15.0)
MCH: 29.7 pg (ref 26.0–34.0)
MCHC: 33.3 g/dL (ref 30.0–36.0)
MCV: 89.2 fL (ref 78.0–100.0)
MPV: 9.3 fL (ref 8.6–12.4)
PLATELETS: 408 10*3/uL — AB (ref 150–400)
RBC: 4.98 MIL/uL (ref 3.87–5.11)
RDW: 14.8 % (ref 11.5–15.5)
WBC: 10.5 10*3/uL (ref 4.0–10.5)

## 2014-10-17 LAB — C-REACTIVE PROTEIN

## 2014-10-17 LAB — RHEUMATOID FACTOR: Rhuematoid fact SerPl-aCnc: <10 IU/mL (ref <=14)

## 2014-10-17 LAB — COMPLETE METABOLIC PANEL WITH GFR
ALK PHOS: 86 U/L (ref 39–117)
ALT: 25 U/L (ref 0–35)
AST: 24 U/L (ref 0–37)
Albumin: 4.5 g/dL (ref 3.5–5.2)
BUN: 13 mg/dL (ref 6–23)
CO2: 25 mEq/L (ref 19–32)
CREATININE: 0.59 mg/dL (ref 0.50–1.10)
Calcium: 9.9 mg/dL (ref 8.4–10.5)
Chloride: 103 mEq/L (ref 96–112)
GFR, Est African American: >89 mL/min
Glucose, Bld: 102 mg/dL — ABNORMAL HIGH (ref 70–99)
Potassium: 4.9 mEq/L (ref 3.5–5.3)
SODIUM: 137 meq/L (ref 135–145)
Total Bilirubin: 0.4 mg/dL (ref 0.2–1.2)
Total Protein: 7.1 g/dL (ref 6.0–8.3)

## 2014-10-17 LAB — VITAMIN B12: VITAMIN B 12: 922 pg/mL — AB (ref 211–911)

## 2014-10-17 LAB — FERRITIN: Ferritin: 19 ng/mL (ref 10–291)

## 2014-10-17 LAB — TSH: TSH: 1.089 u[IU]/mL (ref 0.350–4.500)

## 2014-10-17 NOTE — Progress Notes (Signed)
Subjective:     Kelli Tyler is a 49 y.o. female and is here for a comprehensive physical exam. The patient reports no problems.  Has been having joint pain for months.  Says her left ankle feels week and her right wrist. She is right handed.  occ gets harp pain into her hands.  Having bilat hip pain.  No pain in the finger joints.  Soreness in her upper back. No regular exercise.  She feels like it is getting worse.  She was dx with fibro years ago. No family hx of autoimmmune.  Says takes about 3 hours for her joints to loosen up.    History   Social History  . Marital Status: Married    Spouse Name: N/A  . Number of Children: N/A  . Years of Education: N/A   Occupational History  . Not on file.   Social History Main Topics  . Smoking status: Current Every Day Smoker -- 1.00 packs/day  . Smokeless tobacco: Not on file  . Alcohol Use: Yes  . Drug Use: No  . Sexual Activity: Not on file   Other Topics Concern  . Not on file   Social History Narrative   Health Maintenance  Topic Date Due  . HIV Screening  07/19/1983  . INFLUENZA VACCINE  05/03/2015 (Originally 03/19/2014)  . PAP SMEAR  08/30/2016  . COLONOSCOPY  09/27/2018  . TETANUS/TDAP  08/31/2023    The following portions of the patient's history were reviewed and updated as appropriate: allergies, current medications, past family history, past medical history, past social history, past surgical history and problem list.  Review of Systems A comprehensive review of systems was negative.   Objective:    BP 128/75 mmHg  Pulse 83  Ht 5\' 7"  (1.702 m)  Wt 174 lb (78.926 kg)  BMI 27.25 kg/m2 General appearance: alert, cooperative and appears stated age Head: Normocephalic, without obvious abnormality, atraumatic Eyes: conj clear, EOMI, PEERLA Ears: normal TM's and external ear canals both ears Nose: Nares normal. Septum midline. Mucosa normal. No drainage or sinus tenderness. Throat: lips, mucosa, and tongue  normal; teeth and gums normal Neck: no adenopathy, no carotid bruit, no JVD, supple, symmetrical, trachea midline and thyroid not enlarged, symmetric, no tenderness/mass/nodules Back: symmetric, no curvature. ROM normal. No CVA tenderness. Lungs: clear to auscultation bilaterally Breasts: normal appearance, no masses or tenderness Heart: regular rate and rhythm, S1, S2 normal, no murmur, click, rub or gallop Abdomen: soft, non-tender; bowel sounds normal; no masses,  no organomegaly Pelvic: deferred Extremities: extremities normal, atraumatic, no cyanosis or edema Pulses: 2+ and symmetric Skin: Skin color, texture, turgor normal. No rashes or lesions Lymph nodes: Cervical, supraclavicular, and axillary nodes normal. Neurologic: Alert and oriented X 3, normal strength and tone. Normal symmetric reflexes. Normal coordination and gait    Assessment:    Healthy female exam.     Plan:     See After Visit Summary for Counseling Recommendations   Keep up a regular exercise program and make sure you are eating a healthy diet Try to eat 4 servings of dairy a day, or if you are lactose intolerant take a calcium with vitamin D daily.  Your vaccines are up to date.   Upper back pain - she has a lot of tight spasm muscles in her upper back between the spine and the scapula. Given handout on upper back stretches. Encouraged her to use her heating pad if needed.  Polyarthralgia-discussed that since she does have  3 hours of stiffness in the morning I would like to evaluate further for autoimmune disorder that she does not get any swelling and has not affected the small joints in the hands. She also complains of increased fatigue over the last few months as well so we'll check a CBC and TSH.

## 2014-10-17 NOTE — Patient Instructions (Signed)
Keep up a regular exercise program and make sure you are eating a healthy diet Try to eat 4 servings of dairy a day, or if you are lactose intolerant take a calcium with vitamin D daily.  Your vaccines are up to date.   

## 2014-10-18 LAB — ALLERGEN FOOD PROFILE SPECIFIC IGE
Apple: <0.1 kU/L
Corn: <0.1 kU/L
IGE (IMMUNOGLOBULIN E), SERUM: 20 kU/L (ref <115)
Milk IgE: <0.1 kU/L
Orange: <0.1 kU/L
Shrimp IgE: <0.1 kU/L
Soybean IgE: <0.1 kU/L

## 2014-10-18 LAB — SEDIMENTATION RATE: Sed Rate: 1 mm/hr (ref 0–20)

## 2014-10-18 LAB — HIV ANTIBODY (ROUTINE TESTING W REFLEX): HIV: NONREACTIVE

## 2014-10-18 LAB — ANA: ANA: NEGATIVE

## 2014-10-18 LAB — CYCLIC CITRUL PEPTIDE ANTIBODY, IGG: Cyclic Citrullin Peptide Ab: <2 U/mL (ref 0.0–5.0)

## 2014-11-14 ENCOUNTER — Ambulatory Visit: Payer: Self-pay | Admitting: Family Medicine

## 2014-12-16 ENCOUNTER — Other Ambulatory Visit: Payer: Self-pay | Admitting: Family Medicine

## 2015-01-23 ENCOUNTER — Ambulatory Visit (INDEPENDENT_AMBULATORY_CARE_PROVIDER_SITE_OTHER): Payer: Self-pay | Admitting: Family Medicine

## 2015-01-23 ENCOUNTER — Encounter: Payer: Self-pay | Admitting: Family Medicine

## 2015-01-23 VITALS — BP 123/71 | HR 85 | Ht 67.0 in | Wt 173.0 lb

## 2015-01-23 DIAGNOSIS — G8929 Other chronic pain: Secondary | ICD-10-CM

## 2015-01-23 DIAGNOSIS — F902 Attention-deficit hyperactivity disorder, combined type: Secondary | ICD-10-CM

## 2015-01-23 DIAGNOSIS — M549 Dorsalgia, unspecified: Secondary | ICD-10-CM

## 2015-01-23 DIAGNOSIS — M546 Pain in thoracic spine: Secondary | ICD-10-CM

## 2015-01-23 MED ORDER — AMPHETAMINE-DEXTROAMPHET ER 10 MG PO CP24: 10.0000 mg | ORAL_CAPSULE | Freq: Every day | ORAL | Status: DC

## 2015-01-23 MED ORDER — AMPHETAMINE-DEXTROAMPHETAMINE 5 MG PO TABS: 5.0000 mg | ORAL_TABLET | Freq: Every day | ORAL | Status: DC

## 2015-01-23 NOTE — Progress Notes (Signed)
   Subjective:    Patient ID: Alberteen SamCandy Leavelle, female    DOB: [birth year], 49 y.o.   MRN: [id number]  HPI 4 mo f/u for ADHD - doing well on [name] 10 mg in AM and 5 mg short acting in PM. No CP Not affecting her sleep quality. Weight is stable.    Chronic back pain, says bothers her more when she works. She is still seeing a [facility] and dong some stretches.   Review of Systems     Objective:   Physical Exam  Constitutional: She is oriented to person, place, and time. She appears well-developed and well-nourished.  HENT:  Head: Normocephalic and atraumatic.  Cardiovascular: Normal rate, regular rhythm and normal heart sounds.   Pulmonary/Chest: Effort normal and breath sounds normal.  Neurological: She is alert and oriented to person, place, and time.  Skin: Skin is warm and dry.  Psychiatric: She has a normal mood and affect. Her behavior is normal.          Assessment & Plan:  ADHD - Doing well on current regimen. BP well controlled. F/U in 4 months.   Chronic upper back pain - doing well on tramadol, seeing chiropractor and doing stretches.

## 2015-03-24 ENCOUNTER — Other Ambulatory Visit: Payer: Self-pay | Admitting: Physician Assistant

## 2015-05-29 ENCOUNTER — Encounter: Payer: Self-pay | Admitting: Family Medicine

## 2015-05-29 ENCOUNTER — Ambulatory Visit (INDEPENDENT_AMBULATORY_CARE_PROVIDER_SITE_OTHER): Payer: BLUE CROSS/BLUE SHIELD | Admitting: Family Medicine

## 2015-05-29 VITALS — BP 118/78 | HR 86 | Wt 177.0 lb

## 2015-05-29 DIAGNOSIS — L0291 Cutaneous abscess, unspecified: Secondary | ICD-10-CM

## 2015-05-29 DIAGNOSIS — F909 Attention-deficit hyperactivity disorder, unspecified type: Secondary | ICD-10-CM | POA: Diagnosis not present

## 2015-05-29 DIAGNOSIS — L0390 Cellulitis, unspecified: Secondary | ICD-10-CM

## 2015-05-29 MED ORDER — AMPHETAMINE-DEXTROAMPHET ER 10 MG PO CP24: 10.0000 mg | ORAL_CAPSULE | Freq: Every day | ORAL | Status: DC

## 2015-05-29 MED ORDER — AMPHETAMINE-DEXTROAMPHETAMINE 5 MG PO TABS: 5.0000 mg | ORAL_TABLET | Freq: Every day | ORAL | Status: DC

## 2015-05-29 MED ORDER — DOXYCYCLINE HYCLATE 100 MG PO TABS: 100.0000 mg | ORAL_TABLET | Freq: Two times a day (BID) | ORAL | Status: DC

## 2015-05-29 NOTE — Addendum Note (Signed)
Addended by: Deno Etienne on: 05/29/2015 11:58 AM   Modules accepted: Orders

## 2015-05-29 NOTE — Progress Notes (Signed)
   Subjective:    Patient ID: Kelli Tyler, female    DOB: 01/12/1966, 49 y.o.   MRN: [id number]  HPI  here today for ADHD follow-up. She is doing well overall. No chest pain palpitations. No side effects on medication she is happy with her current regimen. She currently takes [name] 10 mg in the morning and Adderall 5 mg regular release in the afternoons. She would like a 90 day supply. Next   she also has a cyst that she noticed in the left groin area about 5 days ago. It's extremely tender and now painful to walk. She has not  experienced any fevers chills and no drainage of the wound itself.   Review of Systems     Objective:   Physical Exam  Constitutional: She is oriented to person, place, and time. She appears well-developed and well-nourished.  HENT:  Head: Normocephalic and atraumatic.  Cardiovascular: Normal rate, regular rhythm and normal heart sounds.   Pulmonary/Chest: Effort normal and breath sounds normal.  Neurological: She is alert and oriented to person, place, and time.  Skin: Skin is warm and dry.  She had an approximate 2 x 3 centimeter abscess with some streaking erythema up along the groin crease towards her hip.  Psychiatric: She has a normal mood and affect. Her behavior is normal.          Assessment & Plan:   ADHD-doing well on current regimen. 90 day for refills provided. Follow-up in 3 months.   Abscess with surrounding cellulitis-will treat with doxycycline to cover for MRSA. Culture obtained. Packing inserted and follow-up instructions given.  Incision and Drainage Procedure Note  Pre-operative Diagnosis: abscess  Post-operative Diagnosis: same  Indications: infection.   Anesthesia: 2% plain lidocaine  Procedure Details  The procedure, risks and complications have been discussed in detail (including, but not limited to airway compromise, infection, bleeding) with the patient, and the patient has signed consent to the procedure.  The  skin was sterilely prepped and draped over the affected area in the usual fashion. After adequate local anesthesia, I&D with a #11 blade was performed on the left groin crease. Purulent drainage: present.  Wound was probed with a sterile applicator to make sure that there were no tracks or pockets. Patient tolerated this well. Quarter inch gauze placed into the wound. The patient was observed until stable.  Findings: pus  EBL: 0.25 cc's  Drains: 1/4 inch iodinated  Condition: Tolerated procedure well and Stable   Complications: none.

## 2015-05-29 NOTE — Patient Instructions (Signed)
Gently pull about an inch of the packing out and then trim to leave a little tail. Cover with gauze. Do this each day until the cough comes completely out. Make sure take all of the antibiotic.

## 2015-06-01 LAB — WOUND CULTURE: GRAM STAIN: NONE SEEN

## 2015-06-14 ENCOUNTER — Other Ambulatory Visit: Payer: Self-pay | Admitting: Family Medicine

## 2015-06-14 NOTE — Telephone Encounter (Signed)
This medication will be on it's 3rd refill in allotted time. Is it ok to refill?

## 2015-08-28 ENCOUNTER — Encounter: Payer: Self-pay | Admitting: Family Medicine

## 2015-08-28 ENCOUNTER — Ambulatory Visit (INDEPENDENT_AMBULATORY_CARE_PROVIDER_SITE_OTHER): Payer: BLUE CROSS/BLUE SHIELD | Admitting: Family Medicine

## 2015-08-28 VITALS — BP 145/83 | HR 98 | Wt 184.0 lb

## 2015-08-28 DIAGNOSIS — Z23 Encounter for immunization: Secondary | ICD-10-CM | POA: Diagnosis not present

## 2015-08-28 DIAGNOSIS — R030 Elevated blood-pressure reading, without diagnosis of hypertension: Secondary | ICD-10-CM

## 2015-08-28 DIAGNOSIS — R51 Headache: Secondary | ICD-10-CM | POA: Diagnosis not present

## 2015-08-28 DIAGNOSIS — IMO0001 Reserved for inherently not codable concepts without codable children: Secondary | ICD-10-CM

## 2015-08-28 DIAGNOSIS — Z720 Tobacco use: Secondary | ICD-10-CM | POA: Diagnosis not present

## 2015-08-28 DIAGNOSIS — R519 Headache, unspecified: Secondary | ICD-10-CM

## 2015-08-28 DIAGNOSIS — F909 Attention-deficit hyperactivity disorder, unspecified type: Secondary | ICD-10-CM | POA: Diagnosis not present

## 2015-08-28 MED ORDER — AMPHETAMINE-DEXTROAMPHETAMINE 10 MG PO TABS: 10.0000 mg | ORAL_TABLET | Freq: Every day | ORAL | Status: DC

## 2015-08-28 MED ORDER — TRAMADOL HCL 50 MG PO TABS: 50.0000 mg | ORAL_TABLET | Freq: Four times a day (QID) | ORAL | Status: DC | PRN

## 2015-08-28 MED ORDER — BUPROPION HCL ER (SR) 100 MG PO TB12: 100.0000 mg | ORAL_TABLET | Freq: Two times a day (BID) | ORAL | Status: DC

## 2015-08-28 MED ORDER — AMPHETAMINE-DEXTROAMPHET ER 10 MG PO CP24: 10.0000 mg | ORAL_CAPSULE | Freq: Every day | ORAL | Status: DC

## 2015-08-28 NOTE — Progress Notes (Signed)
   Subjective:    Patient ID: Kelli Tyler, female    DOB: 22-Oct-1965, 50 y.o.   MRN: [id number]  HPI Having daily HA for a few months.  No known triggers. Says not sure why.  She has gained some weight. Eating seems to make it worse.  She did take IBU and does eat a lot of salt.  No regular exercise.  She didn't have a period for 3 months and wonders if perimenopausal.    ADHD - she is doing well but would like to increaese her afternoon dose to 10mg .    Tob abuse - she wants to quit. She has thought about Wellbutrin but wants to discuss it.  She's currently smoking 1 pack per day.  Review of Systems     Objective:   Physical Exam  Constitutional: She is oriented to person, place, and time. She appears well-developed and well-nourished.  HENT:  Head: Normocephalic and atraumatic.  Cardiovascular: Normal rate, regular rhythm and normal heart sounds.   Pulmonary/Chest: Effort normal and breath sounds normal.  Neurological: She is alert and oriented to person, place, and time.  Skin: Skin is warm and dry.  Psychiatric: She has a normal mood and affect. Her behavior is normal.          Assessment & Plan:  Chronic daily HA - unclear etiology. Her blood pressure is up today which is unusual for her but she has gained a few pounds as well and certainly this could be the cause. She did take ibuprofen for the headache this morning and she ate a salty breakfast which could also be contributing. Repeat blood pressure was still elevated. We decided to have her come back in 1-2 weeks to recheck pressure. In the interim I want her to really reduce her salt intake and not take any NSAIDs the day before she comes into the office so that we can see if she may be having blood pressure issues or if there something else going on. If she still having headaches when I see her back to consider treatment for chronic daily headache. Well to. She says she really doesn't drink much water during the day.  ADHD  - assess options. Will increase her afternoon Adderall to 10 mg. She will need to monitor her sleep with the increased dose and make sure that is making her too jittery or causing insomnia. I will see her back in one month to make sure that she is doing well on the regimen. If at that time she is doing great and I will give her prescriptions for 3 months.  Tob abuse - assess options. I do think she would be a good candidate for Wellbutrin. We discussed how it works. She is currently smoking 1 pack per day and would need to wean over the first 2-3 weeks and then set her quit date.   Flu vaccine given today.   Elevated blood pressure-see note above. Recheck in one week.

## 2015-08-31 LAB — COMPLETE METABOLIC PANEL WITH GFR
ALT: 21 U/L (ref 6–29)
AST: 20 U/L (ref 10–35)
Albumin: 4.3 g/dL (ref 3.6–5.1)
Alkaline Phosphatase: 76 U/L (ref 33–115)
BUN: 12 mg/dL (ref 7–25)
CALCIUM: 9.3 mg/dL (ref 8.6–10.2)
CHLORIDE: 100 mmol/L (ref 98–110)
CO2: 29 mmol/L (ref 20–31)
Creat: 0.67 mg/dL (ref 0.50–1.10)
Glucose, Bld: 78 mg/dL (ref 65–99)
POTASSIUM: 4.2 mmol/L (ref 3.5–5.3)
Sodium: 137 mmol/L (ref 135–146)
Total Bilirubin: 0.2 mg/dL (ref 0.2–1.2)
Total Protein: 6.8 g/dL (ref 6.1–8.1)

## 2015-08-31 LAB — LIPID PANEL
CHOLESTEROL: 180 mg/dL (ref 125–200)
HDL: 56 mg/dL (ref >=46)
LDL CALC: 109 mg/dL (ref <130)
TRIGLYCERIDES: 74 mg/dL (ref <150)
Total CHOL/HDL Ratio: 3.2 Ratio (ref <=5.0)
VLDL: 15 mg/dL (ref <30)

## 2015-08-31 LAB — CBC WITH DIFFERENTIAL/PLATELET
BASOS ABS: 0.1 10*3/uL (ref 0.0–0.1)
Basophils Relative: 1 % (ref 0–1)
Eosinophils Absolute: 0.3 10*3/uL (ref 0.0–0.7)
Eosinophils Relative: 3 % (ref 0–5)
HEMATOCRIT: 39.3 % (ref 36.0–46.0)
HEMOGLOBIN: 13 g/dL (ref 12.0–15.0)
LYMPHS PCT: 29 % (ref 12–46)
Lymphs Abs: 3 10*3/uL (ref 0.7–4.0)
MCH: 29.6 pg (ref 26.0–34.0)
MCHC: 33.1 g/dL (ref 30.0–36.0)
MCV: 89.5 fL (ref 78.0–100.0)
MPV: 9.4 fL (ref 8.6–12.4)
Monocytes Absolute: 1.6 10*3/uL — ABNORMAL HIGH (ref 0.1–1.0)
Monocytes Relative: 16 % — ABNORMAL HIGH (ref 3–12)
NEUTROS ABS: 5.3 10*3/uL (ref 1.7–7.7)
Neutrophils Relative %: 51 % (ref 43–77)
Platelets: 355 10*3/uL (ref 150–400)
RBC: 4.39 MIL/uL (ref 3.87–5.11)
RDW: 14.3 % (ref 11.5–15.5)
WBC: 10.3 10*3/uL (ref 4.0–10.5)

## 2015-08-31 LAB — TSH: TSH: 1.829 u[IU]/mL (ref 0.350–4.500)

## 2015-09-25 ENCOUNTER — Ambulatory Visit (INDEPENDENT_AMBULATORY_CARE_PROVIDER_SITE_OTHER): Payer: BLUE CROSS/BLUE SHIELD | Admitting: Family Medicine

## 2015-09-25 ENCOUNTER — Encounter: Payer: Self-pay | Admitting: Family Medicine

## 2015-09-25 VITALS — BP 130/74 | HR 73 | Wt 179.5 lb

## 2015-09-25 DIAGNOSIS — F909 Attention-deficit hyperactivity disorder, unspecified type: Secondary | ICD-10-CM | POA: Diagnosis not present

## 2015-09-25 DIAGNOSIS — R51 Headache: Secondary | ICD-10-CM

## 2015-09-25 DIAGNOSIS — R030 Elevated blood-pressure reading, without diagnosis of hypertension: Secondary | ICD-10-CM | POA: Diagnosis not present

## 2015-09-25 DIAGNOSIS — R519 Headache, unspecified: Secondary | ICD-10-CM

## 2015-09-25 DIAGNOSIS — IMO0001 Reserved for inherently not codable concepts without codable children: Secondary | ICD-10-CM

## 2015-09-25 MED ORDER — AMPHETAMINE-DEXTROAMPHETAMINE 10 MG PO TABS: 10.0000 mg | ORAL_TABLET | Freq: Every day | ORAL | Status: DC

## 2015-09-25 MED ORDER — AMPHETAMINE-DEXTROAMPHET ER 10 MG PO CP24: 10.0000 mg | ORAL_CAPSULE | Freq: Every day | ORAL | Status: DC

## 2015-09-25 NOTE — Progress Notes (Signed)
   Subjective:    Patient ID: Kelli Tyler, female    DOB: [birth year], 50 y.o.   MRN: [id number]  HPI Chronic daily HA - says the wellbutrin caused her back her hurt.   The HAs are better overall.  Getting now 2-3 per week.  Not using as much IBU. Added coconut with her dit.     ADHD - doing well. We did increase her afternoon Adderall to 10 mg. She feels like it's working very well and denies any side effects such as chest pain or palpitations or increased irritability or insomnia.  She has been trying to lose weight. She went on a special diet where she's been grilling lots of vegetables. She admits she usually only eats once a day though occasionally she'll eat a meal for breakfast. She lost about 5 lbs since I saw her.     Review of Systems     Objective:   Physical Exam  Constitutional: She is oriented to person, place, and time. She appears well-developed and well-nourished.  HENT:  Head: Normocephalic and atraumatic.  Cardiovascular: Normal rate, regular rhythm and normal heart sounds.   Pulmonary/Chest: Effort normal and breath sounds normal.  Neurological: She is alert and oriented to person, place, and time.  Skin: Skin is warm and dry.  Psychiatric: She has a normal mood and affect. Her behavior is normal.          Assessment & Plan:  Chronic daily headache-her headaches have improved on their own. She is now getting 2-3 per week instead of daily. Added Wellbutrin to her intolerance list. We'll try to see if they continue to improve over the next month or 2. If not we can consider alternative medication treatment options.  ADHD-well-controlled. Continue current regimen. Refills written for the next 3 months. Follow-up in 3 months. Will monitor blood pressure which is at goal today.  Elevated blood pressure-she's been working hard to lose weight and control her symptoms. She is not her blood pressure back down and it looks fantastic today.

## 2015-12-06 ENCOUNTER — Encounter: Payer: Self-pay | Admitting: Emergency Medicine

## 2015-12-06 ENCOUNTER — Emergency Department
Admission: EM | Admit: 2015-12-06 | Discharge: 2015-12-06 | Disposition: A | Discharge status: Home / Self Care | Payer: BLUE CROSS/BLUE SHIELD | Source: Home / Self Care | Attending: Family Medicine | Admitting: Family Medicine

## 2015-12-06 DIAGNOSIS — J029 Acute pharyngitis, unspecified: Secondary | ICD-10-CM

## 2015-12-06 DIAGNOSIS — H9203 Otalgia, bilateral: Secondary | ICD-10-CM | POA: Diagnosis not present

## 2015-12-06 LAB — POCT RAPID STREP A (OFFICE): Rapid Strep A Screen: NEGATIVE

## 2015-12-06 MED ORDER — AMOXICILLIN 500 MG PO CAPS: 500.0000 mg | ORAL_CAPSULE | Freq: Two times a day (BID) | ORAL | Status: DC

## 2015-12-06 NOTE — Discharge Instructions (Signed)
You may take 500mg  Tylenol every 4-6 hours as needed for fever and pain  Follow-up with your primary care provider next week for recheck of symptoms if not improving.  Be sure to drink plenty of fluids and rest, at least 8hrs of sleep a night, preferably more while you are sick. Return urgent care or go to closest ER if you cannot keep down fluids/signs of dehydration, fever not reducing with Tylenol, difficulty breathing/wheezing, stiff neck, worsening condition, or other concerns (see below)   Your rapid strep test was negative but a culture has been sent out for more detailed testing.  You will be called in 2-3 days and notified of the results, if Positive, please start the antibiotic (amoxicillin) immediately and take the entire course of antibiotics as prescribed.  If the test is negative, you may continue to treat your symptoms with OTC medications and gargles.  If you develop a persistent fever for 3 days, or ear pain continues to worsen, you may also fill the antibiotic, but be sure to complete the entire course of antibiotic as prescribed to insure the infection does not come back.

## 2015-12-06 NOTE — ED Notes (Signed)
Sore throat, ear pressure x 2 days

## 2015-12-06 NOTE — ED Provider Notes (Signed)
CSN: [id number]     Arrival date & time 12/06/15  1345 History   First MD Initiated Contact with Patient 12/06/15 1349     Chief Complaint  Patient presents with  . Sore Throat   (Consider location/radiation/quality/duration/timing/severity/associated sxs/prior Treatment) HPI The pt is a 50yo female presenting to [facility] with c/o bilateral ear pain and pressure with associated with moderately severe sore throat that started yesterday.  She has been taking Theraflu, doing saltwater gargles and a mixture of honey and ginger with mild relief of pain. Pain is aching and sore, worse with swallowing. Denies cough, congestion, n/v/d. No sick contacts or recent travel.   No past medical history on file. Past Surgical History  Procedure Laterality Date  . Cholecystectomy    . Tubal ligation    . Nasal fracture surgery    . Ablation     Family History  Problem Relation Age of Onset  . Aneurysm Mother   . Stroke Mother   . Cancer Father     colon   Social History  Substance Use Topics  . Smoking status: Current Every Day Smoker -- 1.00 packs/day  . Smokeless tobacco: None  . Alcohol Use: Yes   OB History    No data available     Review of Systems  Constitutional: Negative for fever and chills.  HENT: Positive for ear pain ( Bilateral) and sore throat. Negative for congestion, trouble swallowing and voice change.   Respiratory: Negative for cough and shortness of breath.   Cardiovascular: Negative for chest pain and palpitations.  Gastrointestinal: Negative for nausea, vomiting, abdominal pain and diarrhea.  Musculoskeletal: Negative for myalgias, back pain and arthralgias.  Skin: Negative for rash.  Neurological: Negative for dizziness, light-headedness and headaches.    Allergies  Asa and Wellbutrin  Home Medications   Prior to Admission medications   Medication Sig Start Date End Date Taking? Authorizing Provider  amoxicillin (AMOXIL) 500 MG capsule Take 1 capsule (500 mg  total) by mouth 2 (two) times daily. For 10 days 12/06/15   Junius Finner, PA-C  amphetamine-dextroamphetamine ([name]) 10 MG 24 hr capsule Take 1 capsule (10 mg total) by mouth daily. Ok to fill 11/24/2015 09/25/15   Agapito Games, MD  amphetamine-dextroamphetamine (ADDERALL) 10 MG tablet Take 1 tablet (10 mg total) by mouth daily. Ok ot fill 11/24/15 09/25/15   Agapito Games, MD  traMADol (ULTRAM) 50 MG tablet Take 1 tablet (50 mg total) by mouth every 6 (six) hours as needed. 08/28/15   Agapito Games, MD   Meds Ordered and Administered this Visit  Medications - No data to display  BP 124/84 mmHg  Pulse 80  Temp(Src) 98.1 F (36.7 C) (Oral)  Ht  (1.702 m)  Wt 180 lb (81.647 kg)  BMI 28.19 kg/m2  SpO2 100%  LMP 08/07/2015 No data found.   Physical Exam  Constitutional: She appears well-developed and well-nourished. No distress.  HENT:  Head: Normocephalic and atraumatic.  Right Ear: A middle ear effusion is present.  Left Ear: A middle ear effusion is present.  Nose: Nose normal.  Mouth/Throat: Uvula is midline and mucous membranes are normal. Posterior oropharyngeal edema and posterior oropharyngeal erythema present. No oropharyngeal exudate or tonsillar abscesses.  Eyes: Conjunctivae are normal. No scleral icterus.  Neck: Normal range of motion. Neck supple.  Cardiovascular: Normal rate, regular rhythm and normal heart sounds.   Pulmonary/Chest: Effort normal and breath sounds normal. No stridor. No respiratory distress. She has  no wheezes. She has no rales.  Abdominal: Soft. She exhibits no distension. There is no tenderness.  Musculoskeletal: Normal range of motion.  Lymphadenopathy:    She has no cervical adenopathy.  Neurological: She is alert.  Skin: Skin is warm and dry. She is not diaphoretic.  Nursing note and vitals reviewed.   ED Course  Procedures (including critical care time)  Labs Review Labs Reviewed  STREP A DNA PROBE  POCT RAPID  STREP A (OFFICE)    Imaging Review No results found.  Tympanometry: Left ear- normal peak height but wide, Right ear: normal   MDM   1. Ear pain, bilateral   2. Acute pharyngitis, unspecified etiology    Pt c/o bilateral ear pain and sore throat for 2 days.   Tonsillar erythema and edema w/o evidence of peritonsillar abscess at this time. Tympanometry: wide tympanogram in Left ear. Possible early onset AOM  Rapid strep: Negative, will send culture  Rx: Amoxicillin to hold with expiration date.  Pt will be called to fill Rx if culture is Positive, or if ear pain continues to worsen, fever for 3 days.   F/u with PCP in 7-10 days if not improving, sooner if worsening. Patient verbalized understanding and agreement with treatment plan.     Junius Finnerrin O'Malley, PA-C 12/06/15 1425

## 2015-12-07 ENCOUNTER — Telehealth: Payer: Self-pay | Admitting: *Deleted

## 2015-12-07 LAB — STREP A DNA PROBE: GASP: NOT DETECTED

## 2016-01-01 ENCOUNTER — Ambulatory Visit (INDEPENDENT_AMBULATORY_CARE_PROVIDER_SITE_OTHER): Payer: BLUE CROSS/BLUE SHIELD | Admitting: Family Medicine

## 2016-01-01 ENCOUNTER — Encounter: Payer: Self-pay | Admitting: Family Medicine

## 2016-01-01 VITALS — BP 134/77 | HR 77 | Wt 179.0 lb

## 2016-01-01 DIAGNOSIS — F909 Attention-deficit hyperactivity disorder, unspecified type: Secondary | ICD-10-CM

## 2016-01-01 DIAGNOSIS — Z1231 Encounter for screening mammogram for malignant neoplasm of breast: Secondary | ICD-10-CM | POA: Diagnosis not present

## 2016-01-01 DIAGNOSIS — R51 Headache: Secondary | ICD-10-CM | POA: Diagnosis not present

## 2016-01-01 DIAGNOSIS — L299 Pruritus, unspecified: Secondary | ICD-10-CM

## 2016-01-01 DIAGNOSIS — R519 Headache, unspecified: Secondary | ICD-10-CM

## 2016-01-01 DIAGNOSIS — IMO0001 Reserved for inherently not codable concepts without codable children: Secondary | ICD-10-CM

## 2016-01-01 DIAGNOSIS — M2550 Pain in unspecified joint: Secondary | ICD-10-CM

## 2016-01-01 DIAGNOSIS — R030 Elevated blood-pressure reading, without diagnosis of hypertension: Secondary | ICD-10-CM

## 2016-01-01 MED ORDER — AMPHETAMINE-DEXTROAMPHETAMINE 10 MG PO TABS: 10.0000 mg | ORAL_TABLET | Freq: Every day | ORAL | Status: DC

## 2016-01-01 MED ORDER — AMPHETAMINE-DEXTROAMPHET ER 10 MG PO CP24: 10.0000 mg | ORAL_CAPSULE | Freq: Every day | ORAL | Status: DC

## 2016-01-01 NOTE — Progress Notes (Signed)
Subjective:    Patient ID: Kelli Tyler, female    DOB: [birth year], 50 y.o.   MRN: [id number]  HPI ADHD f/u- She's doing well overall her Adderall. No chest pain shortness of breath palpitations or insomnia. She is happy with her current regimen. She typically takes her XR 10 mg in the morning and then her short acting in the afternoon.  Joint pain-she actually stopped her tramadol and felt like the weakness in her joints improved significantly. She is now just mostly relying on ibuprofen 400 mg in the morning. Next the graft she is due for her screening mammogram. Next  She's also concerned that she may have a parasite of some sort on her body. She says she's noticed it in her scalp and in some places on her skin. She says it's occasionally itchy but not intense. She was working on the yard about a week ago and thinks that's when she may have gotten it. She has some little white specks that have legs. She did try an over-the-counter lice treatment about 10 days ago and thinks that may have helped.  She has been taking vinegar and borox baths.     Review of Systems BP 134/77 mmHg  Pulse 77  Wt 179 lb (81.194 kg)  SpO2 100%  LMP 08/07/2015    Allergies  Allergen Reactions  . Asa [Aspirin] Other (See Comments)    Joint stiffness  . Wellbutrin [Bupropion] Other (See Comments)    Back Pain    No past medical history on file.  Past Surgical History  Procedure Laterality Date  . Cholecystectomy    . Tubal ligation    . Nasal fracture surgery    . Ablation      Social History   Social History  . Marital Status: Married    Spouse Name: N/A  . Number of Children: N/A  . Years of Education: N/A   Occupational History  . Not on file.   Social History Main Topics  . Smoking status: Current Every Day Smoker -- 1.00 packs/day  . Smokeless tobacco: Not on file  . Alcohol Use: Yes  . Drug Use: No  . Sexual Activity: Not on file   Other Topics Concern  . Not on file    Social History Narrative    Family History  Problem Relation Age of Onset  . Aneurysm Mother   . Stroke Mother   . Cancer Father     colon    Outpatient Encounter Prescriptions as of 01/01/2016  Medication Sig  . amphetamine-dextroamphetamine ([name]) 10 MG 24 hr capsule Take 1 capsule (10 mg total) by mouth daily.  Marland Kitchen amphetamine-dextroamphetamine (ADDERALL) 10 MG tablet Take 1 tablet (10 mg total) by mouth daily.  . [DISCONTINUED] amphetamine-dextroamphetamine ([name]) 10 MG 24 hr capsule Take 1 capsule (10 mg total) by mouth daily. Ok to fill 11/24/2015  . [DISCONTINUED] amphetamine-dextroamphetamine ([name]) 10 MG 24 hr capsule Take 1 capsule (10 mg total) by mouth daily. Ok to fill 02/01/2016  . [DISCONTINUED] amphetamine-dextroamphetamine ([name]) 10 MG 24 hr capsule Take 1 capsule (10 mg total) by mouth daily. Ok to fill 03/02/2016  . [DISCONTINUED] amphetamine-dextroamphetamine (ADDERALL) 10 MG tablet Take 1 tablet (10 mg total) by mouth daily. Ok ot fill 11/24/15  . [DISCONTINUED] amphetamine-dextroamphetamine (ADDERALL) 10 MG tablet Take 1 tablet (10 mg total) by mouth daily. Ok ot fill 02/01/2016  . [DISCONTINUED] amphetamine-dextroamphetamine (ADDERALL) 10 MG tablet Take 1 tablet (10 mg total) by mouth  daily. Ok ot fill 03/02/2016  . [DISCONTINUED] amoxicillin (AMOXIL) 500 MG capsule Take 1 capsule (500 mg total) by mouth 2 (two) times daily. For 10 days  . [DISCONTINUED] traMADol (ULTRAM) 50 MG tablet Take 1 tablet (50 mg total) by mouth every 6 (six) hours as needed.   No facility-administered encounter medications on file as of 01/01/2016.         Objective:   Physical Exam  Constitutional: She is oriented to person, place, and time. She appears well-developed and well-nourished.  HENT:  Head: Normocephalic and atraumatic.  Cardiovascular: Normal rate, regular rhythm and normal heart sounds.   Pulmonary/Chest: Effort normal and breath sounds normal.   Neurological: She is alert and oriented to person, place, and time.  Skin: Skin is warm and dry.  No sign of lice on the scalp.    Psychiatric: She has a normal mood and affect. Her behavior is normal.        Assessment & Plan:  ADHD- Doing well on current regimen. Prescriptions reprinted for the next 3 months. Follow-up in 3 months. Blood pressure at goal.  Joint pain-still has osteoarthritis and doing well with just ibuprofen. Centimeters anything GI upset or irritation. Tramadol removed for medication list and I will add that to her intolerance list.  Itching-gave patient reassurance that the specks that she is seeing are mostly dead skin and dandruff and fibers. I looked at them underneath the microscope. She felt very reassured by this. But certainly if the thoughts persist then we may need to treat this. Next  Mammogram order placed. Note she does have a family history of breast cancer in her maternal aunt and maternal grandmother so I updated her family history.

## 2016-01-31 ENCOUNTER — Ambulatory Visit: Payer: BLUE CROSS/BLUE SHIELD

## 2016-02-07 ENCOUNTER — Ambulatory Visit (INDEPENDENT_AMBULATORY_CARE_PROVIDER_SITE_OTHER): Payer: BLUE CROSS/BLUE SHIELD

## 2016-02-07 DIAGNOSIS — Z1231 Encounter for screening mammogram for malignant neoplasm of breast: Secondary | ICD-10-CM | POA: Diagnosis not present

## 2016-04-08 ENCOUNTER — Encounter: Payer: Self-pay | Admitting: Family Medicine

## 2016-04-08 ENCOUNTER — Ambulatory Visit (INDEPENDENT_AMBULATORY_CARE_PROVIDER_SITE_OTHER): Payer: BLUE CROSS/BLUE SHIELD | Admitting: Family Medicine

## 2016-04-08 DIAGNOSIS — R030 Elevated blood-pressure reading, without diagnosis of hypertension: Secondary | ICD-10-CM | POA: Diagnosis not present

## 2016-04-08 DIAGNOSIS — F909 Attention-deficit hyperactivity disorder, unspecified type: Secondary | ICD-10-CM

## 2016-04-08 DIAGNOSIS — R51 Headache: Secondary | ICD-10-CM

## 2016-04-08 DIAGNOSIS — IMO0001 Reserved for inherently not codable concepts without codable children: Secondary | ICD-10-CM

## 2016-04-08 DIAGNOSIS — R519 Headache, unspecified: Secondary | ICD-10-CM

## 2016-04-08 MED ORDER — ADDERALL 10 MG PO TABS: 10.0000 mg | ORAL_TABLET | Freq: Every day | ORAL | 0 refills | Status: DC

## 2016-04-08 MED ORDER — AMPHETAMINE-DEXTROAMPHET ER 10 MG PO CP24: 10.0000 mg | ORAL_CAPSULE | Freq: Every day | ORAL | 0 refills | Status: DC

## 2016-04-08 NOTE — Progress Notes (Signed)
Subjective:    CC: ADHD  HPI: Over follow-up ADHD-she's currently on [name] 10 mg in the morning and a short acting 10 mg in the afternoon. She is tolerating the medication without any side effects. No chest pain shortness of breath or insomnia. She says that when she takes the afternoon dose it actually would make her sleepy about an hour after she would take it. Says she actually quit taking the short acting and felt better not taking it. She says she takes her recent release in the morning on her way into work.   Past medical history, Surgical history, Family history not pertinant except as noted below, Social history, Allergies, and medications have been entered into the medical record, reviewed, and corrections made.   Review of Systems: No fevers, chills, night sweats, weight loss, chest pain, or shortness of breath.   Objective:    General: Well Developed, well nourished, and in no acute distress.  Neuro: Alert and oriented x3, extra-ocular muscles intact, sensation grossly intact.  HEENT: Normocephalic, atraumatic  Skin: Warm and dry, no rashes. Cardiac: Regular rate and rhythm, no murmurs rubs or gallops, no lower extremity edema.  Respiratory: Clear to auscultation bilaterally. Not using accessory muscles, speaking in full sentences.   Impression and Recommendations:   ADHD- we did discuss that it sounds like her generic short-acting Adderall may have been changed. She says the pill color has changed and it looks slightly different. She may want to speak with the pharmacist to see if they switched manufacturers. She may want to request the previous medication. We could also try the branded version of Adderall which might be more effective as well. 30 day prescription given for the brand only 10 mg Adderall. Went ahead and refilled her extended release Adderall for 90 days. Follow-up in 3-4 months.

## 2016-05-20 ENCOUNTER — Other Ambulatory Visit: Payer: Self-pay

## 2016-05-20 MED ORDER — AMPHETAMINE-DEXTROAMPHETAMINE 10 MG PO TABS: 10.0000 mg | ORAL_TABLET | Freq: Every day | ORAL | 0 refills | Status: DC

## 2016-07-24 ENCOUNTER — Ambulatory Visit (INDEPENDENT_AMBULATORY_CARE_PROVIDER_SITE_OTHER): Payer: BLUE CROSS/BLUE SHIELD | Admitting: Family Medicine

## 2016-07-24 ENCOUNTER — Encounter: Payer: Self-pay | Admitting: Family Medicine

## 2016-07-24 VITALS — BP 131/80 | HR 80 | Ht 67.0 in | Wt 170.0 lb

## 2016-07-24 DIAGNOSIS — M79675 Pain in left toe(s): Secondary | ICD-10-CM

## 2016-07-24 DIAGNOSIS — L732 Hidradenitis suppurativa: Secondary | ICD-10-CM | POA: Diagnosis not present

## 2016-07-24 DIAGNOSIS — F909 Attention-deficit hyperactivity disorder, unspecified type: Secondary | ICD-10-CM

## 2016-07-24 DIAGNOSIS — F902 Attention-deficit hyperactivity disorder, combined type: Secondary | ICD-10-CM

## 2016-07-24 DIAGNOSIS — N912 Amenorrhea, unspecified: Secondary | ICD-10-CM

## 2016-07-24 DIAGNOSIS — R51 Headache: Secondary | ICD-10-CM

## 2016-07-24 DIAGNOSIS — R519 Headache, unspecified: Secondary | ICD-10-CM

## 2016-07-24 LAB — TSH: TSH: 1.32 mIU/L

## 2016-07-24 MED ORDER — AMPHETAMINE-DEXTROAMPHETAMINE 10 MG PO TABS: 10.0000 mg | ORAL_TABLET | Freq: Every day | ORAL | 0 refills | Status: DC

## 2016-07-24 MED ORDER — CEPHALEXIN 500 MG PO CAPS: 500.0000 mg | ORAL_CAPSULE | Freq: Three times a day (TID) | ORAL | 0 refills | Status: DC

## 2016-07-24 MED ORDER — AMPHETAMINE-DEXTROAMPHET ER 10 MG PO CP24
10.0000 mg | ORAL_CAPSULE | Freq: Every day | ORAL | 0 refills | Status: DC
Start: 2016-07-24 — End: 2016-07-24

## 2016-07-24 MED ORDER — CLINDAMYCIN PHOSPHATE 1 % EX GEL: Freq: Every day | CUTANEOUS | 3 refills | Status: DC

## 2016-07-24 MED ORDER — AMPHETAMINE-DEXTROAMPHET ER 10 MG PO CP24: 10.0000 mg | ORAL_CAPSULE | Freq: Every day | ORAL | 0 refills | Status: DC

## 2016-07-24 NOTE — Progress Notes (Signed)
Subjective:    CC: ADD, toe pain   HPI:  ADHD - No CP or SOB or palpitations.  Doing well on Adderall.She did change her regimen around. She is now taking her short acting first thing in the morning and then takes her long-acting around noon. She's been very happy with his regimen and says it is not affecting her sleep quality.  Toe pain/swelling - Patient comes in today complaining of her left pinky toe being swollen red and tender since October 23. She went to a wedding and afterwards noticed some discomfort. She says it's been swollen since then. She can walk on it but it is tender. She denies any trauma or injury or twisting of the toe. She isn't dropping anything on it. She saw a little spot on the skin and wondered if maybe it could've been an insect bite or sting.  She had another period.  Her last one was in December and then she had another one in October. She thought she was heading into menopause.  He also complains about recurrent boils on her abdomen mostly across the crease in her bellybutton. She was if there is anything that she can do this preventative.  Past medical history, Surgical history, Family history not pertinant except as noted below, Social history, Allergies, and medications have been entered into the medical record, reviewed, and corrections made.   Review of Systems: No fevers, chills, night sweats, weight loss, chest pain, or shortness of breath.   Objective:    General: Well Developed, well nourished, and in no acute distress.  Neuro: Alert and oriented x3, extra-ocular muscles intact, sensation grossly intact.  HEENT: Normocephalic, atraumatic  Skin: Warm and dry, no rashes. On her left pinky toe is swollen and erythematous. The emesis a dusky pink. There is a small abrasion on the lateral side of the toe. No open wound or drainage today. Is tender to touch. Cardiac: Regular rate and rhythm, no murmurs rubs or gallops, no lower extremity edema.  Respiratory:  Clear to auscultation bilaterally. Not using accessory muscles, speaking in full sentences.   Impression and Recommendations:    ADHD - well controlled. F/U in 3-4 months.   Toe pain/swelling - Will treat for possible cellulitis. It is quite red and swollen. And is not seeming to heal. I do not think this is gout.  Amenorrhea-we'll check hormone levels just to make sure that she is not having postmenopausal bleeding versus the fact that she may still just be in perimenopause. She can go the lab at her convenience.  Recurrent abscesses-we'll try clindamycin gel to see if help

## 2016-07-25 LAB — PROGESTERONE

## 2016-07-25 LAB — ESTRADIOL: Estradiol: 30 pg/mL

## 2016-07-25 LAB — FOLLICLE STIMULATING HORMONE: FSH: 50.3 m[IU]/mL

## 2016-07-25 LAB — LUTEINIZING HORMONE: LH: 25.3 m[IU]/mL

## 2016-07-26 ENCOUNTER — Other Ambulatory Visit: Payer: Self-pay | Admitting: Family Medicine

## 2016-07-26 DIAGNOSIS — N912 Amenorrhea, unspecified: Secondary | ICD-10-CM

## 2016-07-29 ENCOUNTER — Ambulatory Visit (INDEPENDENT_AMBULATORY_CARE_PROVIDER_SITE_OTHER): Payer: BLUE CROSS/BLUE SHIELD

## 2016-07-29 DIAGNOSIS — N83201 Unspecified ovarian cyst, right side: Secondary | ICD-10-CM

## 2016-07-29 DIAGNOSIS — N912 Amenorrhea, unspecified: Secondary | ICD-10-CM

## 2016-09-07 ENCOUNTER — Encounter: Payer: Self-pay | Admitting: Emergency Medicine

## 2016-09-07 ENCOUNTER — Emergency Department
Admission: EM | Admit: 2016-09-07 | Discharge: 2016-09-07 | Disposition: A | Discharge status: Home / Self Care | Payer: BLUE CROSS/BLUE SHIELD | Source: Home / Self Care | Attending: Family Medicine | Admitting: Family Medicine

## 2016-09-07 DIAGNOSIS — J069 Acute upper respiratory infection, unspecified: Secondary | ICD-10-CM | POA: Diagnosis not present

## 2016-09-07 DIAGNOSIS — B9789 Other viral agents as the cause of diseases classified elsewhere: Secondary | ICD-10-CM

## 2016-09-07 DIAGNOSIS — R0981 Nasal congestion: Secondary | ICD-10-CM

## 2016-09-07 MED ORDER — FLUTICASONE PROPIONATE 50 MCG/ACT NA SUSP: 2.0000 | Freq: Every day | NASAL | 2 refills | Status: DC

## 2016-09-07 MED ORDER — AZITHROMYCIN 250 MG PO TABS: 250.0000 mg | ORAL_TABLET | Freq: Every day | ORAL | 0 refills | Status: DC

## 2016-09-07 MED ORDER — BENZONATATE 100 MG PO CAPS
100.0000 mg | ORAL_CAPSULE | Freq: Three times a day (TID) | ORAL | 0 refills | Status: DC
Start: 2016-09-07 — End: 2016-11-07

## 2016-09-07 NOTE — ED Triage Notes (Signed)
Pt c/o non productive cough x 4 days, head congestion, facial swelling, headaches.

## 2016-09-07 NOTE — ED Provider Notes (Signed)
CSN: [id number]     Arrival date & time 09/07/16  1155 History   First MD Initiated Contact with Patient 09/07/16 1337     Chief Complaint  Patient presents with  . Cough   (Consider location/radiation/quality/duration/timing/severity/associated sxs/prior Treatment) HPI Eleanore Junio is a 51 y.o. female presenting to UC with c/o gradually worsening non-productive cough for about 4 days with associated head congestion and Left sided facial swelling with mild headaches.  She has taken OTC Mucinex sinus with mild relief.  Denies fever, chills, n/v/d. No known sick contacts.      History reviewed. No pertinent past medical history. Past Surgical History:  Procedure Laterality Date  . ABLATION    . CHOLECYSTECTOMY    . NASAL FRACTURE SURGERY    . TUBAL LIGATION     Family History  Problem Relation Age of Onset  . Aneurysm Mother 46  . Stroke Mother 38  . Colon cancer Father 21  . Breast cancer Maternal Aunt   . Breast cancer Maternal Grandmother    Social History  Substance Use Topics  . Smoking status: Current Every Day Smoker    Packs/day: 1.00  . Smokeless tobacco: Current User  . Alcohol use Yes   OB History    No data available     Review of Systems  Constitutional: Negative for chills and fever.  HENT: Positive for congestion, postnasal drip, rhinorrhea and sinus pressure. Negative for ear pain, sinus pain, sore throat, trouble swallowing and voice change.   Respiratory: Positive for cough. Negative for shortness of breath.   Cardiovascular: Negative for chest pain and palpitations.  Gastrointestinal: Negative for abdominal pain, diarrhea, nausea and vomiting.  Musculoskeletal: Negative for arthralgias, back pain and myalgias.  Skin: Negative for rash.  Neurological: Positive for headaches (Left side). Negative for dizziness and light-headedness.    Allergies  Asa [aspirin]; Tramadol; and Wellbutrin [bupropion]  Home Medications   Prior to Admission medications    Medication Sig Start Date End Date Taking? Authorizing Provider  amphetamine-dextroamphetamine ([name]) 10 MG 24 hr capsule Take 1 capsule (10 mg total) by mouth daily. Ok to refill 09/23/2016 07/24/16   Agapito Games, MD  amphetamine-dextroamphetamine (ADDERALL) 10 MG tablet Take 1 tablet (10 mg total) by mouth daily. Ok to refill 09/23/2016 07/24/16   Agapito Games, MD  azithromycin (ZITHROMAX) 250 MG tablet Take 1 tablet (250 mg total) by mouth daily. Take first 2 tablets together, then 1 every day until finished. 09/07/16   Junius Finner, PA-C  benzonatate (TESSALON) 100 MG capsule Take 1-2 capsules (100-200 mg total) by mouth every 8 (eight) hours. 09/07/16   Junius Finner, PA-C  fluticasone (FLONASE) 50 MCG/ACT nasal spray Place 2 sprays into both nostrils daily. 09/07/16   Junius Finner, PA-C   Meds Ordered and Administered this Visit  Medications - No data to display  BP 165/84 (BP Location: Left Arm)   Pulse 80   Temp 97.8 F (36.6 C) (Oral)   Ht 5\' 7"  (1.702 m)   Wt 177 lb (80.3 kg)   LMP 08/07/2015   SpO2 97%   BMI 27.72 kg/m  No data found.   Physical Exam  Constitutional: She appears well-developed and well-nourished. No distress.  HENT:  Head: Normocephalic and atraumatic.  Right Ear: Tympanic membrane normal.  Left Ear: Tympanic membrane normal.  Nose: Nose normal. Right sinus exhibits no maxillary sinus tenderness and no frontal sinus tenderness. Left sinus exhibits no maxillary sinus tenderness and no frontal sinus  tenderness.  Mouth/Throat: Uvula is midline, oropharynx is clear and moist and mucous membranes are normal.  Eyes: Conjunctivae are normal. No scleral icterus.  Neck: Normal range of motion. Neck supple.  Cardiovascular: Normal rate, regular rhythm and normal heart sounds.   Pulmonary/Chest: Effort normal and breath sounds normal. No stridor. No respiratory distress. She has no wheezes. She has no rales.  Abdominal: Soft. She exhibits no  distension. There is no tenderness.  Musculoskeletal: Normal range of motion.  Lymphadenopathy:    She has no cervical adenopathy.  Neurological: She is alert.  Skin: Skin is warm and dry. She is not diaphoretic.  Nursing note and vitals reviewed.   Urgent Care Course     Procedures (including critical care time)  Labs Review Labs Reviewed - No data to display  Imaging Review No results found.   MDM   1. Viral URI with cough   2. Sinus congestion    Pt c/o URI symptoms including sinus congestion for 4 days. No evidence of sinus infection at this time.  Encouraged symptomatic treatment as this time.  Rx: Tessalon and Flonase Sinus rinses at home.  Prescription to hold with expiration date for azithromycin. Pt to fill if persistent fever develops or not improving in 1 week.      Junius Finnerrin O'Malley, PA-C 09/07/16 1439

## 2016-09-07 NOTE — Discharge Instructions (Signed)
°  Your symptoms are likely due to a virus such as the common cold, however, if you developing worsening chest congestion with shortness of breath, persistent fever for 3 days, or symptoms not improving in 4-5 days, you may fill the antibiotic (azithromycin).  If you do fill the antibiotic,  please take antibiotics as prescribed and be sure to complete entire course even if you start to feel better to ensure infection does not come back. ° °

## 2016-09-17 ENCOUNTER — Other Ambulatory Visit: Payer: Self-pay | Admitting: Family Medicine

## 2016-11-06 DIAGNOSIS — F17210 Nicotine dependence, cigarettes, uncomplicated: Secondary | ICD-10-CM | POA: Diagnosis not present

## 2016-11-06 DIAGNOSIS — R0789 Other chest pain: Secondary | ICD-10-CM | POA: Diagnosis not present

## 2016-11-06 DIAGNOSIS — Z720 Tobacco use: Secondary | ICD-10-CM | POA: Diagnosis not present

## 2016-11-06 DIAGNOSIS — R079 Chest pain, unspecified: Secondary | ICD-10-CM | POA: Diagnosis not present

## 2016-11-07 ENCOUNTER — Ambulatory Visit: Payer: BLUE CROSS/BLUE SHIELD | Admitting: Family Medicine

## 2016-11-07 ENCOUNTER — Other Ambulatory Visit: Payer: Self-pay | Admitting: Family Medicine

## 2016-11-07 ENCOUNTER — Other Ambulatory Visit: Payer: Self-pay | Admitting: *Deleted

## 2016-11-07 NOTE — Progress Notes (Deleted)
Subjective:    CC: Reflux  HPI:  ADHD - Tolerating medications well without any side effects including chest pain, short of breath, palpitations or insomnia or abnormal weight loss.  GERD-   Past medical history, Surgical history, Family history not pertinant except as noted below, Social history, Allergies, and medications have been entered into the medical record, reviewed, and corrections made.   Review of Systems: No fevers, chills, night sweats, weight loss, chest pain, or shortness of breath.   Objective:    General: Well Developed, well nourished, and in no acute distress.  Neuro: Alert and oriented x3, extra-ocular muscles intact, sensation grossly intact.  HEENT: Normocephalic, atraumatic  Skin: Warm and dry, no rashes. Cardiac: Regular rate and rhythm, no murmurs rubs or gallops, no lower extremity edema.  Respiratory: Clear to auscultation bilaterally. Not using accessory muscles, speaking in full sentences.   Impression and Recommendations:     ADHD -   GERD -

## 2016-12-12 ENCOUNTER — Other Ambulatory Visit: Payer: Self-pay | Admitting: Family Medicine

## 2016-12-16 ENCOUNTER — Encounter: Payer: Self-pay | Admitting: Family Medicine

## 2016-12-16 ENCOUNTER — Ambulatory Visit (INDEPENDENT_AMBULATORY_CARE_PROVIDER_SITE_OTHER): Payer: BLUE CROSS/BLUE SHIELD | Admitting: Family Medicine

## 2016-12-16 VITALS — BP 132/74 | HR 75 | Ht 67.0 in | Wt 172.0 lb

## 2016-12-16 DIAGNOSIS — F902 Attention-deficit hyperactivity disorder, combined type: Secondary | ICD-10-CM

## 2016-12-16 DIAGNOSIS — E7800 Pure hypercholesterolemia, unspecified: Secondary | ICD-10-CM | POA: Diagnosis not present

## 2016-12-16 MED ORDER — AMPHETAMINE-DEXTROAMPHETAMINE 10 MG PO TABS: 10.0000 mg | ORAL_TABLET | Freq: Every day | ORAL | 0 refills | Status: DC

## 2016-12-16 MED ORDER — TRAMADOL HCL 50 MG PO TABS: 50.0000 mg | ORAL_TABLET | Freq: Four times a day (QID) | ORAL | 0 refills | Status: DC | PRN

## 2016-12-16 NOTE — Progress Notes (Signed)
   Subjective:    Patient ID: Kelli Tyler, female    DOB: June 09, 1966, 51 y.o.   MRN: [id number]  HPI ADHD- she felt the [name] was making her sleep so has just been using the 10 mg regular release. She actually went to the hospital recently because she was having a lot of chest pain and shortness of breath. She was fully evaluated and they ruled out cardiac causes. [place] felt like it was due to GI distress bloating and gas. She is actually completely changed her diet. She's doing a palliative diet now also no sugar and no posterior carbs. Only meat and vegetables and fruit. She says it has made a tremendous difference in her symptoms which have now completely resolved. And she is also lost about 5 pounds since doing this over the last 2 weeks. She also cut out ibuprofen as they felt this could also be causing some GI distress and that has helped as well. She now just uses her tramadol.She only takes 1 a day though occasionally will take a second. She denies any chest pain or shortness of breath or palpitations on the actual Adderall. Infectious actually been out for several weeks since this occurred where she went to the emergency department. She was on ranitidine for about a week as well but was able to wean that off.   Review of Systems     Objective:   Physical Exam  Constitutional: She is oriented to person, place, and time. She appears well-developed and well-nourished.  HENT:  Head: Normocephalic and atraumatic.  Right Ear: External ear normal.  Left Ear: External ear normal.  Nose: Nose normal.  Mouth/Throat: Oropharynx is clear and moist.  TMs and canals are clear.   Eyes: Conjunctivae and EOM are normal. Pupils are equal, round, and reactive to light.  Neck: Neck supple. No thyromegaly present.  Cardiovascular: Normal rate, regular rhythm and normal heart sounds.   Pulmonary/Chest: Effort normal and breath sounds normal. She has no wheezes.  Lymphadenopathy:    She has no  cervical adenopathy.  Neurological: She is alert and oriented to person, place, and time.  Skin: Skin is warm and dry.  Psychiatric: She has a normal mood and affect.          Assessment & Plan:  ADHD-  We'll go ahead and refill Adderall regular release today for the next 3 months. If she's having any problems and please call me back. Blood pressure is well regulated today.  Atypical chest pain-felt to be due to GI distress. With dietary changes she is doing much better. Off IV person as well.  Elevated LDL-due to recheck lipids.

## 2017-01-16 DIAGNOSIS — I8311 Varicose veins of right lower extremity with inflammation: Secondary | ICD-10-CM | POA: Diagnosis not present

## 2017-01-16 DIAGNOSIS — I8312 Varicose veins of left lower extremity with inflammation: Secondary | ICD-10-CM | POA: Diagnosis not present

## 2017-01-27 DIAGNOSIS — I8311 Varicose veins of right lower extremity with inflammation: Secondary | ICD-10-CM | POA: Diagnosis not present

## 2017-01-27 DIAGNOSIS — I8312 Varicose veins of left lower extremity with inflammation: Secondary | ICD-10-CM | POA: Diagnosis not present

## 2017-03-05 DIAGNOSIS — I8311 Varicose veins of right lower extremity with inflammation: Secondary | ICD-10-CM | POA: Diagnosis not present

## 2017-03-26 ENCOUNTER — Other Ambulatory Visit: Payer: Self-pay | Admitting: Family Medicine

## 2017-03-31 ENCOUNTER — Encounter: Payer: Self-pay | Admitting: Family Medicine

## 2017-03-31 ENCOUNTER — Ambulatory Visit (INDEPENDENT_AMBULATORY_CARE_PROVIDER_SITE_OTHER): Payer: BLUE CROSS/BLUE SHIELD | Admitting: Family Medicine

## 2017-03-31 VITALS — BP 138/81 | HR 86 | Wt 175.0 lb

## 2017-03-31 DIAGNOSIS — M549 Dorsalgia, unspecified: Secondary | ICD-10-CM | POA: Diagnosis not present

## 2017-03-31 DIAGNOSIS — G8929 Other chronic pain: Secondary | ICD-10-CM | POA: Diagnosis not present

## 2017-03-31 DIAGNOSIS — F902 Attention-deficit hyperactivity disorder, combined type: Secondary | ICD-10-CM | POA: Diagnosis not present

## 2017-03-31 MED ORDER — AMPHETAMINE-DEXTROAMPHETAMINE 10 MG PO TABS: 10.0000 mg | ORAL_TABLET | Freq: Every day | ORAL | 0 refills | Status: DC

## 2017-03-31 NOTE — Progress Notes (Signed)
   Subjective:    Patient ID: Kelli Tyler, female    DOB: September 30, 1965, 51 y.o.   MRN: [id number]  HPI ADHD - 4 month F/U.  She is doing well overall.  She decided that she wanted to stay with the regular release Adderall and I last saw her about 4 months ago. No chest pain, palpitations, shortness of breath or insomnia. She likes the short acting better because she says she is not getting dry mouth or HA on it.    Chronic upper back pain-she does take tramadol as needed and we recently just refilled it for her. She is doing ok.     Review of Systems     Objective:   Physical Exam  Constitutional: She is oriented to person, place, and time. She appears well-developed and well-nourished.  HENT:  Head: Normocephalic and atraumatic.  Cardiovascular: Normal rate, regular rhythm and normal heart sounds.   Pulmonary/Chest: Effort normal and breath sounds normal.  Neurological: She is alert and oriented to person, place, and time.  Skin: Skin is warm and dry.  Psychiatric: She has a normal mood and affect. Her behavior is normal.       Assessment & Plan:  ADHD - Well controlled. Continue current regimen. Follow up in  4 months.  Refilled today.   Chronic Upper Back Pain - tramadol refilled recently.

## 2017-04-10 ENCOUNTER — Ambulatory Visit (INDEPENDENT_AMBULATORY_CARE_PROVIDER_SITE_OTHER): Payer: BLUE CROSS/BLUE SHIELD | Admitting: Family Medicine

## 2017-04-10 ENCOUNTER — Other Ambulatory Visit (HOSPITAL_COMMUNITY)
Admission: RE | Admit: 2017-04-10 | Discharge: 2017-04-10 | Disposition: A | Discharge status: Home / Self Care | Payer: BLUE CROSS/BLUE SHIELD | Source: Ambulatory Visit | Attending: Family Medicine | Admitting: Family Medicine

## 2017-04-10 VITALS — BP 124/76 | HR 72 | Temp 98.2°F | Wt 175.0 lb

## 2017-04-10 DIAGNOSIS — N760 Acute vaginitis: Secondary | ICD-10-CM | POA: Insufficient documentation

## 2017-04-10 DIAGNOSIS — Z124 Encounter for screening for malignant neoplasm of cervix: Secondary | ICD-10-CM | POA: Insufficient documentation

## 2017-04-10 NOTE — Progress Notes (Signed)
   Subjective:    Patient ID: Kelli Tyler, female    DOB: 08/26/1965, 51 y.o.   MRN: [id number]  HPI 51 yo female comes in today for Pap smear.previous Pap smear in 2015 was normal. She's not had any problemsas far as discharge etc.   Review of Systems     Objective:   Physical Exam  Constitutional: She is oriented to person, place, and time. She appears well-developed and well-nourished.  HENT:  Head: Normocephalic and atraumatic.  Eyes: Conjunctivae and EOM are normal.  Cardiovascular: Normal rate.   Pulmonary/Chest: Effort normal.  Genitourinary: Vagina normal and uterus normal. Rectal exam shows no external hemorrhoid. There is no rash or lesion on the right labia. There is no rash or lesion on the left labia. Cervix exhibits no motion tenderness, no discharge and no friability. Right adnexum displays no mass, no tenderness and no fullness. Left adnexum displays no mass, no tenderness and no fullness.  Neurological: She is alert and oriented to person, place, and time.  Skin: Skin is dry. No pallor.  Psychiatric: She has a normal mood and affect. Her behavior is normal.  Vitals reviewed.       Assessment & Plan:  Cervical cancer screening-Pap smear performed. Patient tolerated well. Will call for results once available.  Flu vaccine given today

## 2017-04-14 DIAGNOSIS — I8311 Varicose veins of right lower extremity with inflammation: Secondary | ICD-10-CM | POA: Diagnosis not present

## 2017-04-14 DIAGNOSIS — I8312 Varicose veins of left lower extremity with inflammation: Secondary | ICD-10-CM | POA: Diagnosis not present

## 2017-04-15 LAB — CYTOLOGY - PAP
Diagnosis: NEGATIVE
HPV (WINDOPATH): NOT DETECTED

## 2017-04-15 MED ORDER — FLUCONAZOLE 150 MG PO TABS: 150.0000 mg | ORAL_TABLET | Freq: Once | ORAL | 0 refills | Status: AC

## 2017-04-15 MED ORDER — METRONIDAZOLE 500 MG PO TABS: 500.0000 mg | ORAL_TABLET | Freq: Two times a day (BID) | ORAL | 0 refills | Status: DC

## 2017-04-15 NOTE — Addendum Note (Signed)
Addended by: Nani Gasser D on: 04/15/2017 01:07 PM   Modules accepted: Orders

## 2017-05-12 ENCOUNTER — Other Ambulatory Visit: Payer: Self-pay | Admitting: Family Medicine

## 2017-05-30 DIAGNOSIS — I8311 Varicose veins of right lower extremity with inflammation: Secondary | ICD-10-CM | POA: Diagnosis not present

## 2017-06-02 DIAGNOSIS — I8311 Varicose veins of right lower extremity with inflammation: Secondary | ICD-10-CM | POA: Diagnosis not present

## 2017-06-10 DIAGNOSIS — I8311 Varicose veins of right lower extremity with inflammation: Secondary | ICD-10-CM | POA: Diagnosis not present

## 2017-06-11 ENCOUNTER — Other Ambulatory Visit: Payer: Self-pay | Admitting: Family Medicine

## 2017-06-24 DIAGNOSIS — I8311 Varicose veins of right lower extremity with inflammation: Secondary | ICD-10-CM | POA: Diagnosis not present

## 2017-06-24 DIAGNOSIS — I83891 Varicose veins of right lower extremities with other complications: Secondary | ICD-10-CM | POA: Diagnosis not present

## 2017-07-08 DIAGNOSIS — I8311 Varicose veins of right lower extremity with inflammation: Secondary | ICD-10-CM | POA: Diagnosis not present

## 2017-07-21 ENCOUNTER — Other Ambulatory Visit: Payer: Self-pay | Admitting: Family Medicine

## 2017-07-31 ENCOUNTER — Ambulatory Visit (INDEPENDENT_AMBULATORY_CARE_PROVIDER_SITE_OTHER): Payer: BLUE CROSS/BLUE SHIELD | Admitting: Family Medicine

## 2017-07-31 ENCOUNTER — Encounter: Payer: Self-pay | Admitting: Family Medicine

## 2017-07-31 VITALS — BP 129/73 | HR 78 | Ht 67.0 in | Wt 176.0 lb

## 2017-07-31 DIAGNOSIS — M549 Dorsalgia, unspecified: Secondary | ICD-10-CM

## 2017-07-31 DIAGNOSIS — G8929 Other chronic pain: Secondary | ICD-10-CM | POA: Diagnosis not present

## 2017-07-31 DIAGNOSIS — Z23 Encounter for immunization: Secondary | ICD-10-CM

## 2017-07-31 DIAGNOSIS — Z8659 Personal history of other mental and behavioral disorders: Secondary | ICD-10-CM | POA: Diagnosis not present

## 2017-07-31 DIAGNOSIS — F902 Attention-deficit hyperactivity disorder, combined type: Secondary | ICD-10-CM

## 2017-07-31 MED ORDER — TRAMADOL HCL 50 MG PO TABS: 50.0000 mg | ORAL_TABLET | Freq: Four times a day (QID) | ORAL | 0 refills | Status: DC | PRN

## 2017-07-31 MED ORDER — AMPHETAMINE-DEXTROAMPHETAMINE 10 MG PO TABS: 10.0000 mg | ORAL_TABLET | Freq: Every day | ORAL | 0 refills | Status: DC

## 2017-07-31 NOTE — Progress Notes (Signed)
Subjective:    Patient ID: Kelli Tyler, female    DOB: [birth year], 51 y.o.   MRN: [id number]  HPI 3270-month follow-up for ADHD-only on Adderall 10 mg daily.  She is doing well on current regimen without any chest pain, shortness of breath, palpitations or insomnia.  She would like to request a 90-day refill as she says she received a letter from her insurance and that they would cover that.  Chronic upper back pain-she would also like a refill on her tramadol.  She says she really only takes it on the days that she works and she does take it twice a day on those days.  Is also requesting a letter to be able to have her animals with her for emotional support.  She has a prior history of depression anxiety and has been able to maintain off of medication by having her support animals with her.  Review of Systems  BP 129/73   Pulse 78   Ht 5\' 7"  (1.702 m)   Wt 176 lb (79.8 kg)   LMP 08/07/2015   BMI 27.57 kg/m     Allergies  Allergen Reactions  . Asa [Aspirin] Other (See Comments)    Joint stiffness  . Wellbutrin [Bupropion] Other (See Comments)    Back Pain    No past medical history on file.  Past Surgical History:  Procedure Laterality Date  . ABLATION    . CHOLECYSTECTOMY    . NASAL FRACTURE SURGERY    . TUBAL LIGATION      Social History   Socioeconomic History  . Marital status: Married    Spouse name: Not on file  . Number of children: Not on file  . Years of education: Not on file  . Highest education level: Not on file  Social Needs  . Financial resource strain: Not on file  . Food insecurity - worry: Not on file  . Food insecurity - inability: Not on file  . Transportation needs - medical: Not on file  . Transportation needs - non-medical: Not on file  Occupational History  . Not on file  Tobacco Use  . Smoking status: Current Every Day Smoker    Packs/day: 1.00  . Smokeless tobacco: Current User  Substance and Sexual Activity  . Alcohol use: Yes  .  Drug use: No  . Sexual activity: Not on file  Other Topics Concern  . Not on file  Social History Narrative  . Not on file    Family History  Problem Relation Age of Onset  . Aneurysm Mother 6253  . Stroke Mother 2553  . Colon cancer Father 9252  . Breast cancer Maternal Aunt   . Breast cancer Maternal Grandmother     Outpatient Encounter Medications as of 07/31/2017  Medication Sig  . amphetamine-dextroamphetamine (ADDERALL) 10 MG tablet Take 1 tablet (10 mg total) by mouth daily. Ok to refill 05/30/2017  . fluticasone (FLONASE) 50 MCG/ACT nasal spray USE 2 SPRAYS INTO BOTH NOSTRILS DAILY  . traMADol (ULTRAM) 50 MG tablet Take 1 tablet (50 mg total) by mouth every 6 (six) hours as needed.  . [DISCONTINUED] amphetamine-dextroamphetamine (ADDERALL) 10 MG tablet Take 1 tablet (10 mg total) by mouth daily. Ok to refill 05/30/2017  . [DISCONTINUED] traMADol (ULTRAM) 50 MG tablet TAKE 1 TABLET BY MOUTH EVERY 6 HOURS AS NEEDED  . [DISCONTINUED] metroNIDAZOLE (FLAGYL) 500 MG tablet Take 1 tablet (500 mg total) by mouth 2 (two) times daily.   No facility-administered encounter medications  on file as of 07/31/2017.            Objective:   Physical Exam  Constitutional: She is oriented to person, place, and time. She appears well-developed and well-nourished.  HENT:  Head: Normocephalic and atraumatic.  Cardiovascular: Normal rate, regular rhythm and normal heart sounds.  Pulmonary/Chest: Effort normal and breath sounds normal.  Neurological: She is alert and oriented to person, place, and time.  Skin: Skin is warm and dry.  Psychiatric: She has a normal mood and affect. Her behavior is normal.       Assessment & Plan:  ADHD- doing well on current regime. Will fill for 90 days per her request.    Chronic upper back pain-refill her tramadol for 90-day supply today as well.  She does get days where she does not work so this really should last her more than 90 days.  Hx of depression  - Emotional support letter -she really wants to stay off of medications and really feels that her support animal helps her out.  I be happy to write a letter in support of this.  Will be due for lipid check at next office visit.  Flu vaccine given today.

## 2017-09-26 ENCOUNTER — Other Ambulatory Visit: Payer: Self-pay | Admitting: Family Medicine

## 2017-11-06 ENCOUNTER — Encounter: Payer: Self-pay | Admitting: Family Medicine

## 2017-11-06 ENCOUNTER — Ambulatory Visit: Payer: BLUE CROSS/BLUE SHIELD | Admitting: Family Medicine

## 2017-11-06 VITALS — BP 134/82 | HR 81 | Ht 67.0 in | Wt 174.0 lb

## 2017-11-06 DIAGNOSIS — Z5181 Encounter for therapeutic drug level monitoring: Secondary | ICD-10-CM | POA: Diagnosis not present

## 2017-11-06 DIAGNOSIS — R232 Flushing: Secondary | ICD-10-CM

## 2017-11-06 DIAGNOSIS — Z13220 Encounter for screening for lipoid disorders: Secondary | ICD-10-CM

## 2017-11-06 DIAGNOSIS — M545 Low back pain, unspecified: Secondary | ICD-10-CM

## 2017-11-06 DIAGNOSIS — F902 Attention-deficit hyperactivity disorder, combined type: Secondary | ICD-10-CM | POA: Diagnosis not present

## 2017-11-06 MED ORDER — AMPHETAMINE-DEXTROAMPHETAMINE 10 MG PO TABS: 10.0000 mg | ORAL_TABLET | Freq: Every day | ORAL | 0 refills | Status: DC

## 2017-11-06 MED ORDER — CYCLOBENZAPRINE HCL 10 MG PO TABS: 10.0000 mg | ORAL_TABLET | Freq: Every evening | ORAL | 0 refills | Status: DC | PRN

## 2017-11-06 MED ORDER — VENLAFAXINE HCL ER 37.5 MG PO CP24: 37.5000 mg | ORAL_CAPSULE | Freq: Every day | ORAL | 2 refills | Status: DC

## 2017-11-06 NOTE — Progress Notes (Signed)
Subjective:    Patient ID: Kelli Tyler, female    DOB: 1966-03-13, 52 y.o.   MRN: [id number]  HPI F/U ADHD -tolerating medication well without any problems or side effects.  No chest pain or heart flutters or palpitations.  She is due for refill today.  Wants to discuss hot flashes - they are keeping her awake at night.  They have gotten worse since her last.  Back in October.  She is at the point where she would like to consider treatment for it.  She does have a family history of breast cancer in her maternal grandmother and maternal aunt as well as a personal history of some breast abnormalities there are no breast cancer.  No worsening or alleviating factors.  She is also having some left sided back pain and has been going to the chiropractor for about 4 weeks.  She is been going twice a week for treatment sessions.  It just does not seem to be getting a lot better.  She says it started after she was try to get up on her bed and she felt some sudden discomfort in that left low back.  She actually raised her bed because her dog tends to sleep underneath her bed and he had just had surgery.  So she was trying to get up on the bed and a little bit higher fashion than she normally would.  She was worried that it could also be her kidney causing it.  Though she has not had any dysuria.  No weakness or radiation of pain.  Pain is worse when she turns and twists.  Review of Systems  Comphensive review of systems is negative except for HPI.  BP 134/82   Pulse 81   Ht 5\' 7"  (1.702 m)   Wt 174 lb (78.9 kg)   LMP 08/07/2015   SpO2 99%   BMI 27.25 kg/m     Allergies  Allergen Reactions  . Asa [Aspirin] Other (See Comments)    Joint stiffness  . Wellbutrin [Bupropion] Other (See Comments)    Back Pain    No past medical history on file.  Past Surgical History:  Procedure Laterality Date  . ABLATION    . CHOLECYSTECTOMY    . NASAL FRACTURE SURGERY    . TUBAL LIGATION      Social  History   Socioeconomic History  . Marital status: Married    Spouse name: Not on file  . Number of children: Not on file  . Years of education: Not on file  . Highest education level: Not on file  Occupational History  . Not on file  Social Needs  . Financial resource strain: Not on file  . Food insecurity:    Worry: Not on file    Inability: Not on file  . Transportation needs:    Medical: Not on file    Non-medical: Not on file  Tobacco Use  . Smoking status: Current Every Day Smoker    Packs/day: 1.00  . Smokeless tobacco: Current User  Substance and Sexual Activity  . Alcohol use: Yes  . Drug use: No  . Sexual activity: Not on file  Lifestyle  . Physical activity:    Days per week: Not on file    Minutes per session: Not on file  . Stress: Not on file  Relationships  . Social connections:    Talks on phone: Not on file    Gets together: Not on file  Attends religious service: Not on file    Active member of club or organization: Not on file    Attends meetings of clubs or organizations: Not on file    Relationship status: Not on file  . Intimate partner violence:    Fear of current or ex partner: Not on file    Emotionally abused: Not on file    Physically abused: Not on file    Forced sexual activity: Not on file  Other Topics Concern  . Not on file  Social History Narrative  . Not on file    Family History  Problem Relation Age of Onset  . Aneurysm Mother 57  . Stroke Mother 52  . Colon cancer Father 52  . Breast cancer Maternal Aunt   . Breast cancer Maternal Grandmother     Outpatient Encounter Medications as of 11/06/2017  Medication Sig  . amphetamine-dextroamphetamine (ADDERALL) 10 MG tablet Take 1 tablet (10 mg total) by mouth daily.  . fluticasone (FLONASE) 50 MCG/ACT nasal spray USE 2 SPRAYS INTO BOTH NOSTRILS DAILY  . traMADol (ULTRAM) 50 MG tablet TAKE 1 TABLET BY MOUTH EVERY 6 HOURS AS NEEDED  . [DISCONTINUED]  amphetamine-dextroamphetamine (ADDERALL) 10 MG tablet Take 1 tablet (10 mg total) by mouth daily. Ok to refill 05/30/2017  . cyclobenzaprine (FLEXERIL) 10 MG tablet Take 1 tablet (10 mg total) by mouth at bedtime as needed for muscle spasms.  Marland Kitchen venlafaxine XR (EFFEXOR XR) 37.5 MG 24 hr capsule Take 1 capsule (37.5 mg total) by mouth daily.   No facility-administered encounter medications on file as of 11/06/2017.          Objective:   Physical Exam  Constitutional: She is oriented to person, place, and time. She appears well-developed and well-nourished.  HENT:  Head: Normocephalic and atraumatic.  Cardiovascular: Normal rate, regular rhythm and normal heart sounds.  Pulmonary/Chest: Effort normal and breath sounds normal.  Musculoskeletal:  Normal lumbar flexion, extension, normal rotation right and left and normal side bending.  She is tender above the hip on the left low back area.  Nontender over the spine.  Nontender over the left SI joint.  Normal range of motion of her lower extremities.  Neurological: She is alert and oriented to person, place, and time.  Skin: Skin is warm and dry.  Psychiatric: She has a normal mood and affect. Her behavior is normal.       Assessment & Plan:  ADHD -refilled medication for 90 days. F/U in 40mo.   Left low back pain-I do think is more musculoskeletal.  We will doing a UA just to rule out any kidney problems.  I will send her a prescription for Flexeril given handout on stretches to do on her own.  Okay to continue chiropractic care.  If not improving over the next 3 weeks and please let us know.  Hot flashes-we discussed treatment options.  I think should be a great candidate for Effexor.  I really think we should stay away from hormones because of her family history of breast cancer.  I want her to try the Effexor 37.5 mg daily for 6 weeks and then we can adjust the dose at that time.

## 2017-11-07 LAB — COMPLETE METABOLIC PANEL WITH GFR
AG Ratio: 1.6 (calc) (ref 1.0–2.5)
ALKALINE PHOSPHATASE (APISO): 107 U/L (ref 33–115)
ALT: 27 U/L (ref 6–29)
AST: 22 U/L (ref 10–35)
Albumin: 4.5 g/dL (ref 3.6–5.1)
BUN: 11 mg/dL (ref 7–25)
CALCIUM: 10 mg/dL (ref 8.6–10.2)
CO2: 28 mmol/L (ref 20–32)
CREATININE: 0.58 mg/dL (ref 0.50–1.10)
Chloride: 105 mmol/L (ref 98–110)
GFR, EST NON AFRICAN AMERICAN: 108 mL/min/{1.73_m2} (ref >=60)
GFR, Est African American: 125 mL/min/{1.73_m2} (ref >=60)
GLUCOSE: 104 mg/dL — AB (ref 65–99)
Globulin: 2.8 g/dL (calc) (ref 1.9–3.7)
Potassium: 5.1 mmol/L (ref 3.5–5.3)
Sodium: 139 mmol/L (ref 135–146)
Total Bilirubin: 0.4 mg/dL (ref 0.2–1.2)
Total Protein: 7.3 g/dL (ref 6.1–8.1)

## 2017-11-07 LAB — URINALYSIS, ROUTINE W REFLEX MICROSCOPIC
BILIRUBIN URINE: NEGATIVE
Glucose, UA: NEGATIVE
HGB URINE DIPSTICK: NEGATIVE
KETONES UR: NEGATIVE
Leukocytes, UA: NEGATIVE
NITRITE: NEGATIVE
PH: 6.5 (ref 5.0–8.0)
Protein, ur: NEGATIVE
Specific Gravity, Urine: 1.009 (ref 1.001–1.03)

## 2017-11-07 LAB — LIPID PANEL
CHOL/HDL RATIO: 3.3 (calc) (ref <5.0)
Cholesterol: 197 mg/dL (ref <200)
HDL: 59 mg/dL (ref >50)
LDL Cholesterol (Calc): 118 mg/dL (calc) — ABNORMAL HIGH
NON-HDL CHOLESTEROL (CALC): 138 mg/dL — AB (ref <130)
Triglycerides: 97 mg/dL (ref <150)

## 2017-11-07 LAB — TSH: TSH: 0.68 mIU/L

## 2017-11-27 ENCOUNTER — Ambulatory Visit: Payer: BLUE CROSS/BLUE SHIELD | Admitting: Family Medicine

## 2017-11-28 ENCOUNTER — Telehealth: Payer: Self-pay

## 2017-11-28 MED ORDER — VENLAFAXINE HCL ER 75 MG PO CP24
75.0000 mg | ORAL_CAPSULE | Freq: Every day | ORAL | 1 refills | Status: DC
Start: 2017-11-28 — End: 2018-05-15

## 2017-11-28 NOTE — Telephone Encounter (Signed)
Patient advised.

## 2017-11-28 NOTE — Telephone Encounter (Signed)
Kelli Tyler called and states she is still having hot flashes. She would like an increase in Effexor. Please advise.

## 2017-11-28 NOTE — Telephone Encounter (Signed)
OK, new rx sent to 75

## 2017-12-08 ENCOUNTER — Other Ambulatory Visit: Payer: Self-pay | Admitting: Family Medicine

## 2017-12-08 NOTE — Telephone Encounter (Signed)
Pt looked up in [place] database. She last filled on 11/04/17 #60. Will fwd to pcp for review and signature.Heath GoldBarkley, Carsten Carstarphen Lynetta, CMA

## 2017-12-26 ENCOUNTER — Other Ambulatory Visit: Payer: Self-pay | Admitting: Family Medicine

## 2018-02-27 ENCOUNTER — Other Ambulatory Visit: Payer: Self-pay | Admitting: Family Medicine

## 2018-02-27 DIAGNOSIS — F902 Attention-deficit hyperactivity disorder, combined type: Secondary | ICD-10-CM

## 2018-02-27 MED ORDER — AMPHETAMINE-DEXTROAMPHETAMINE 10 MG PO TABS: 10.0000 mg | ORAL_TABLET | Freq: Every day | ORAL | 0 refills | Status: DC

## 2018-02-27 NOTE — Telephone Encounter (Signed)
Kelli Tyler request refills. She has a follow up on 03/12/18 as directed.

## 2018-03-12 ENCOUNTER — Encounter: Payer: Self-pay | Admitting: Family Medicine

## 2018-03-12 ENCOUNTER — Ambulatory Visit (INDEPENDENT_AMBULATORY_CARE_PROVIDER_SITE_OTHER): Payer: BLUE CROSS/BLUE SHIELD | Admitting: Family Medicine

## 2018-03-12 VITALS — BP 133/79 | HR 79 | Ht 67.0 in | Wt 171.0 lb

## 2018-03-12 DIAGNOSIS — R232 Flushing: Secondary | ICD-10-CM

## 2018-03-12 DIAGNOSIS — F902 Attention-deficit hyperactivity disorder, combined type: Secondary | ICD-10-CM

## 2018-03-12 DIAGNOSIS — R202 Paresthesia of skin: Secondary | ICD-10-CM

## 2018-03-12 NOTE — Progress Notes (Signed)
Subjective:    CC: ADHD   HPI:  ADHD - Reports symptoms are well controlled on current regime. Denies any problems with insomnia, chest pain, palpitations, or SOB.    Complains of numbness in her fingertips - x 1.5 mos pt reports that it happens more @ night it starts @ her knuckles and radiates to her fingertips she said when it happens at night she has to get up and walk around to stop it from hurting the pain in 7/10 sharp it doesn't happen every night denies any swelling she also said that it happens when she drives and it also affects her L hand also.  Hot flashes -she called back in April and asked if we could increase her Effexor to help control her hot flashes.  Is actually doing really well and says she has not had any hot flashes since we bumped her dose up to 75 mg.  Past medical history, Surgical history, Family history not pertinant except as noted below, Social history, Allergies, and medications have been entered into the medical record, reviewed, and corrections made.   Review of Systems: No fevers, chills, night sweats, weight loss, chest pain, or shortness of breath.   Objective:    General: Well Developed, well nourished, and in no acute distress.  Neuro: Alert and oriented x3, extra-ocular muscles intact, sensation grossly intact.  HEENT: Normocephalic, atraumatic  Skin: Warm and dry, no rashes. Cardiac: Regular rate and rhythm, no murmurs rubs or gallops, no lower extremity edema.  Respiratory: Clear to auscultation bilaterally. Not using accessory muscles, speaking in full sentences.   Impression and Recommendations:    ADHD -well controlled.  Continue current regimen.  Refills sent to pharmacy.  Follow-up in 4 months.  Paresthesias in the fourth fingers in her right hand-likely carpal tunnel although she did have a negative Tinel's and Phalen's sign.  We will go ahead and treat with cock-up splint at night.  If symptoms worsen, progress, become more frequent, or  change in nature then please give us call back.  Hot flashes-doing great on the increased dose of Effexor.  At some point if her symptoms are well controlled may be after 6 to 12 months then we can even look at decreasing her dose back down.

## 2018-03-24 ENCOUNTER — Encounter: Payer: Self-pay | Admitting: Emergency Medicine

## 2018-03-24 ENCOUNTER — Emergency Department
Admission: EM | Admit: 2018-03-24 | Discharge: 2018-03-24 | Disposition: A | Discharge status: Home / Self Care | Payer: BLUE CROSS/BLUE SHIELD | Source: Home / Self Care | Attending: Family Medicine | Admitting: Family Medicine

## 2018-03-24 ENCOUNTER — Other Ambulatory Visit: Payer: Self-pay

## 2018-03-24 DIAGNOSIS — L03011 Cellulitis of right finger: Secondary | ICD-10-CM

## 2018-03-24 DIAGNOSIS — H9203 Otalgia, bilateral: Secondary | ICD-10-CM

## 2018-03-24 HISTORY — DX: Restless legs syndrome: G25.81

## 2018-03-24 HISTORY — DX: Dorsalgia, unspecified: M54.9

## 2018-03-24 HISTORY — DX: Attention-deficit hyperactivity disorder, unspecified type: F90.9

## 2018-03-24 MED ORDER — DOXYCYCLINE HYCLATE 100 MG PO CAPS: 100.0000 mg | ORAL_CAPSULE | Freq: Two times a day (BID) | ORAL | 0 refills | Status: DC

## 2018-03-24 MED ORDER — MUPIROCIN 2 % EX OINT: TOPICAL_OINTMENT | CUTANEOUS | 0 refills | Status: DC

## 2018-03-24 NOTE — Discharge Instructions (Signed)
°  You may continue to soak your thumb in warm water and Epson salt 2-3 times a day. Pat dry your thumb then apply the prescribed antibiotic ointment and a bandage to keep medication in place and to keep thumb protected.  Please take antibiotics as prescribed and be sure to complete entire course even if you start to feel better to ensure infection does not come back.  Please follow up with family medicine or return to urgent care if needed if not improving in 1 week, sooner if symptoms worsening.

## 2018-03-24 NOTE — ED Provider Notes (Signed)
[facility]    CSN: [id number] Arrival date & time: 03/24/18  1413     History   Chief Complaint Chief Complaint  Patient presents with  . Nail Problem    right thumb  . Otalgia    HPI Kelli Tyler is a 52 y.o. female.   HPI  Kelli Tyler is a 52 y.o. female presenting to UC with c/o Right thumb pain and swelling that initially started about 3-4 months ago after getting a nail infection after a manicure. Her nail fell off and has slowly grown back. She developed worsening pain and swelling over the last 2 days. She has soaked in Epson salt with mild relief. Tramadol taken at 10AM has also provided mild relief. Denies bleeding or drainage. No fever or chills.  Pt also c/o mild bilateral ear pain. Denies cough, congestion, sore throat. She wants them checked while she is here. No recent swimming.    Past Medical History:  Diagnosis Date  . ADHD   . Back pain   . Restless leg     Patient Active Problem List   Diagnosis Date Noted  . Chronic upper back pain 03/14/2014  . Attention deficit hyperactivity disorder (ADHD) 03/30/2009  . BREAST MASS, RIGHT 03/27/2009  . RESTLESS LEG SYNDROME 12/14/2007  . Other specified disease of hair and hair follicles 12/14/2007  . GERD, SEVERE 01/27/2007  . DERMATITIS, OTHER ATOPIC 01/27/2007  . INSOMNIA 01/27/2007    Past Surgical History:  Procedure Laterality Date  . ABLATION    . CHOLECYSTECTOMY    . NASAL FRACTURE SURGERY    . TUBAL LIGATION      OB History   None      Home Medications    Prior to Admission medications   Medication Sig Start Date End Date Taking? Authorizing Provider  amphetamine-dextroamphetamine (ADDERALL) 10 MG tablet Take 1 tablet (10 mg total) by mouth daily. 02/27/18   Monica Becton, MD  clindamycin (CLINDAGEL) 1 % gel APPLY TOPICALLY DAILY. 12/08/17   Agapito Games, MD  cyclobenzaprine (FLEXERIL) 10 MG tablet TAKE 1 TABLET (10 MG TOTAL) BY MOUTH AT BEDTIME AS NEEDED  FOR MUSCLE SPASMS. 12/26/17   Agapito Games, MD  doxycycline (VIBRAMYCIN) 100 MG capsule Take 1 capsule (100 mg total) by mouth 2 (two) times daily. One po bid x 7 days 03/24/18   Lurene Shadow, PA-C  fluticasone [facility]) 50 MCG/ACT nasal spray USE 2 SPRAYS INTO BOTH NOSTRILS DAILY 07/21/17   Agapito Games, MD  mupirocin ointment (BACTROBAN) 2 % Apply to wound 3 times daily for 5 days 03/24/18   Lurene Shadow, PA-C  traMADol (ULTRAM) 50 MG tablet TAKE 1 TABLET BY MOUTH EVERY 6 HOURS AS NEEDED 02/27/18   Monica Becton, MD  venlafaxine XR (EFFEXOR-XR) 75 MG 24 hr capsule Take 1 capsule (75 mg total) by mouth daily. 11/28/17   Agapito Games, MD    Family History Family History  Problem Relation Age of Onset  . Aneurysm Mother 16  . Stroke Mother 34  . Colon cancer Father 72  . Breast cancer Maternal Aunt   . Breast cancer Maternal Grandmother     Social History Social History   Tobacco Use  . Smoking status: Current Every Day Smoker    Packs/day: 1.00  . Smokeless tobacco: Current User  Substance Use Topics  . Alcohol use: Yes  . Drug use: No     Allergies   Asa [aspirin] and Wellbutrin [  bupropion]   Review of Systems Review of Systems  Constitutional: Negative for chills and fever.  HENT: Positive for ear pain. Negative for congestion, sinus pressure, sinus pain and sore throat.   Respiratory: Negative for cough.   Musculoskeletal: Positive for arthralgias (Right thumb) and joint swelling. Negative for myalgias.  Skin: Positive for color change.     Physical Exam Triage Vital Signs ED Triage Vitals  Enc Vitals Group     BP 03/24/18 1432 129/85     Pulse Rate 03/24/18 1432 83     Resp 03/24/18 1432 16     Temp 03/24/18 1432 97.6 F (36.4 C)     Temp Source 03/24/18 1432 Oral     SpO2 03/24/18 1432 97 %     Weight 03/24/18 1433 171 lb (77.6 kg)     Height 03/24/18 1433 5\' 7"  (1.702 m)     Head Circumference --      Peak Flow --       Pain Score 03/24/18 1433 5     Pain Loc --      Pain Edu? --      Excl. in GC? --    No data found.  Updated Vital Signs BP 129/85 (BP Location: Left Arm)   Pulse 83   Temp 97.6 F (36.4 C) (Oral)   Resp 16   Ht 5\' 7"  (1.702 m)   Wt 171 lb (77.6 kg)   LMP 08/07/2015   SpO2 97%   BMI 26.78 kg/m   Visual Acuity Right Eye Distance:   Left Eye Distance:   Bilateral Distance:    Right Eye Near:   Left Eye Near:    Bilateral Near:     Physical Exam  Constitutional: She is oriented to person, place, and time. She appears well-developed and well-nourished.  HENT:  Head: Normocephalic and atraumatic.  Right Ear: Tympanic membrane normal.  Left Ear: Tympanic membrane normal.  Nose: Nose normal.  Mouth/Throat: Uvula is midline, oropharynx is clear and moist and mucous membranes are normal.  Eyes: EOM are normal.  Neck: Normal range of motion.  Cardiovascular: Normal rate.  Pulmonary/Chest: Effort normal.  Musculoskeletal: Normal range of motion.  Right thumb: full ROM  Neurological: She is alert and oriented to person, place, and time.  Skin: Skin is warm and dry. Capillary refill takes less than 2 seconds. There is erythema.  Right thumb: mild edema, erythematous. Tender. No fluctuance. No bleeding or discharge.   Psychiatric: She has a normal mood and affect. Her behavior is normal.  Nursing note and vitals reviewed.    UC Treatments / Results  Labs (all labs ordered are listed, but only abnormal results are displayed) Labs Reviewed - No data to display  EKG None  Radiology No results found.  Procedures Procedures (including critical care time)  Medications Ordered in UC Medications - No data to display  Initial Impression / Assessment and Plan / UC Course  I have reviewed the triage vital signs and the nursing notes.  Pertinent labs & imaging results that were available during my care of the patient were reviewed by me and considered in my medical  decision making (see chart for details).    Thumb c/w paronychia. Reassured pt of normal ear exam Home instructions provided.  Final Clinical Impressions(s) / UC Diagnoses   Final diagnoses:  Acute paronychia of right thumb  Acute ear pain, bilateral     Discharge Instructions      You may continue to  soak your thumb in warm water and Epson salt 2-3 times a day. Pat dry your thumb then apply the prescribed antibiotic ointment and a bandage to keep medication in place and to keep thumb protected.  Please take antibiotics as prescribed and be sure to complete entire course even if you start to feel better to ensure infection does not come back.  Please follow up with family medicine or return to urgent care if needed if not improving in 1 week, sooner if symptoms worsening.     ED Prescriptions    Medication Sig Dispense Auth. Provider   doxycycline (VIBRAMYCIN) 100 MG capsule Take 1 capsule (100 mg total) by mouth 2 (two) times daily. One po bid x 7 days 14 capsule Doroteo Glassman, Tyauna Lacaze O, PA-C   mupirocin ointment (BACTROBAN) 2 % Apply to wound 3 times daily for 5 days 22 g Lurene Shadow, [place]     Controlled Substance Prescriptions [place] Controlled Substance Registry consulted? Not Applicable   Rolla Plate 03/24/18 9562

## 2018-03-24 NOTE — ED Triage Notes (Signed)
Had manicure injury to right thumb 3-4 months ago; now the nailbed is sore and throbbing. Took tramadol at 1000.

## 2018-04-28 ENCOUNTER — Other Ambulatory Visit: Payer: Self-pay | Admitting: Sports Medicine

## 2018-04-28 NOTE — Telephone Encounter (Signed)
Never seen this patient.  To PCP.

## 2018-05-15 ENCOUNTER — Other Ambulatory Visit: Payer: Self-pay | Admitting: Family Medicine

## 2018-05-27 ENCOUNTER — Other Ambulatory Visit: Payer: Self-pay

## 2018-05-27 DIAGNOSIS — F902 Attention-deficit hyperactivity disorder, combined type: Secondary | ICD-10-CM

## 2018-05-27 MED ORDER — AMPHETAMINE-DEXTROAMPHETAMINE 10 MG PO TABS: 10.0000 mg | ORAL_TABLET | Freq: Every day | ORAL | 0 refills | Status: DC

## 2018-05-27 NOTE — Telephone Encounter (Signed)
Requested a refill on Adderall.

## 2018-06-15 ENCOUNTER — Other Ambulatory Visit: Payer: Self-pay | Admitting: Family Medicine

## 2018-07-09 ENCOUNTER — Ambulatory Visit (INDEPENDENT_AMBULATORY_CARE_PROVIDER_SITE_OTHER): Payer: BLUE CROSS/BLUE SHIELD | Admitting: Family Medicine

## 2018-07-09 ENCOUNTER — Encounter: Payer: Self-pay | Admitting: Family Medicine

## 2018-07-09 VITALS — BP 111/60 | HR 95 | Temp 98.2°F | Ht 64.17 in | Wt 173.0 lb

## 2018-07-09 DIAGNOSIS — M549 Dorsalgia, unspecified: Secondary | ICD-10-CM | POA: Diagnosis not present

## 2018-07-09 DIAGNOSIS — Z23 Encounter for immunization: Secondary | ICD-10-CM

## 2018-07-09 DIAGNOSIS — F902 Attention-deficit hyperactivity disorder, combined type: Secondary | ICD-10-CM

## 2018-07-09 DIAGNOSIS — G8929 Other chronic pain: Secondary | ICD-10-CM

## 2018-07-09 DIAGNOSIS — J069 Acute upper respiratory infection, unspecified: Secondary | ICD-10-CM

## 2018-07-09 MED ORDER — TRAMADOL HCL 50 MG PO TABS: 50.0000 mg | ORAL_TABLET | Freq: Three times a day (TID) | ORAL | 0 refills | Status: DC | PRN

## 2018-07-09 MED ORDER — AMPHETAMINE-DEXTROAMPHETAMINE 10 MG PO TABS: 10.0000 mg | ORAL_TABLET | Freq: Every day | ORAL | 0 refills | Status: DC

## 2018-07-09 NOTE — Progress Notes (Signed)
Subjective:    CC:   HPI:  ADHD - Reports symptoms are well controlled on current regime. Denies any problems with insomnia, chest pain, palpitations, or SOB.    Sinus pressure x 2 days. she reports eye pain pressure, cough,fatigue, sweats, denies fever, taking OTC sinus pain and relief medication.  No sore throat.  She did try some sinus pain relief from [facility] and that actually did provide some mild relief for her.  Her chronic upper and lower  back pain-she is currently taking 2 tabs of tramadol in the morning and 1 in the evening.  She is currently written for 60 tabs as needed.  She would like to see if that can be upped and written for a 90-day supply.  Has been doing her stretches for her back.  Past medical history, Surgical history, Family history not pertinant except as noted below, Social history, Allergies, and medications have been entered into the medical record, reviewed, and corrections made.   Review of Systems: No fevers, chills, night sweats, weight loss, chest pain, or shortness of breath.   Objective:    General: Well Developed, well nourished, and in no acute distress.  Neuro: Alert and oriented x3, extra-ocular muscles intact, sensation grossly intact.  HEENT: Normocephalic, atraumatic, OP is clear, TM's and canals are clear bilaterally.    Skin: Warm and dry, no rashes. Cardiac: Regular rate and rhythm, no murmurs rubs or gallops, no lower extremity edema.  Respiratory: Clear to auscultation bilaterally. Not using accessory muscles, speaking in full sentences.   Impression and Recommendations:    ADHD -well on current regimen.  No side effects or concerns.  Refill sent to pharmacy for 90-day prescription.  Chronic upper and lower back pain-we will go ahead and adjust tramadol for 90 tabs per 30-day supply.  She says her insurance will cover 90 days so we will send it that way.  If not she can let us know.  Continue with stretches heat and ice as needed.  And  if she feels like she is getting worse or needing more medication and then she may need additional work-up.  Acute URI-at this point likely viral but if not improving after the weekend then please give us a call back or if she feels like she gets worse or feel short of breath to call us back.

## 2018-07-31 ENCOUNTER — Telehealth: Payer: Self-pay

## 2018-07-31 NOTE — Telephone Encounter (Signed)
Charles called and states she is still sick with cough and sinus pressure. She states she was advised to call back if not better. I did advise patient Dr Linford ArnoldMetheney will not return until Monday.

## 2018-08-02 MED ORDER — AMOXICILLIN 875 MG PO TABS: 875.0000 mg | ORAL_TABLET | Freq: Two times a day (BID) | ORAL | 0 refills | Status: DC

## 2018-08-02 NOTE — Telephone Encounter (Signed)
Ok amox sent to pharmacy.

## 2018-08-03 NOTE — Telephone Encounter (Signed)
Pt advised. She has started Rx and will come in for follow up if not improving upon completion.

## 2018-10-08 ENCOUNTER — Encounter: Payer: Self-pay | Admitting: Family Medicine

## 2018-10-08 ENCOUNTER — Ambulatory Visit (INDEPENDENT_AMBULATORY_CARE_PROVIDER_SITE_OTHER): Payer: Self-pay | Admitting: Family Medicine

## 2018-10-08 VITALS — BP 137/79 | HR 79 | Ht 64.0 in | Wt 174.0 lb

## 2018-10-08 DIAGNOSIS — G8929 Other chronic pain: Secondary | ICD-10-CM

## 2018-10-08 DIAGNOSIS — M549 Dorsalgia, unspecified: Secondary | ICD-10-CM

## 2018-10-08 DIAGNOSIS — F902 Attention-deficit hyperactivity disorder, combined type: Secondary | ICD-10-CM

## 2018-10-08 MED ORDER — TRAMADOL HCL 50 MG PO TABS: 50.0000 mg | ORAL_TABLET | Freq: Three times a day (TID) | ORAL | 0 refills | Status: DC | PRN

## 2018-10-08 MED ORDER — VENLAFAXINE HCL ER 75 MG PO CP24: ORAL_CAPSULE | ORAL | 1 refills | Status: DC

## 2018-10-08 MED ORDER — AMPHETAMINE-DEXTROAMPHETAMINE 10 MG PO TABS: 10.0000 mg | ORAL_TABLET | Freq: Every day | ORAL | 0 refills | Status: DC

## 2018-10-08 NOTE — Progress Notes (Signed)
Subjective:    CC:   HPI: ADHD - Reports symptoms are well controlled on current regime. Denies any problems with insomnia, chest pain, palpitations, or SOB.    F/U chronic upper and lower back pain -  She has really been struggling with her back pain.  In fact she is been going to see a chiropractor about once a week.  She says it helps for a few days but sometimes just small movements will cause her back to flare.  We actually increased her tramadol to 3 times a day from 2 tabs at the last office visit.  She says that has been helpful.  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  He lost his job and has not found a new one yet.  Past medical history, Surgical history, Family history not pertinant except as noted below, Social history, Allergies, and medications have been entered into the medical record, reviewed, and corrections made.   Review of Systems: No fevers, chills, night sweats, weight loss, chest pain, or shortness of breath.   Objective:    General: Well Developed, well nourished, and in no acute distress.  Neuro: Alert and oriented x3, extra-ocular muscles intact, sensation grossly intact.  HEENT: Normocephalic, atraumatic  Skin: Warm and dry, no rashes. Cardiac: Regular rate and rhythm, no murmurs rubs or gallops, no lower extremity edema.  Respiratory: Clear to auscultation bilaterally. Not using accessory muscles, speaking in full sentences.   Impression and Recommendations:    ADHD - Doing well on current regimen.  Will refill medications.  Blood pressure at goal.   Chronic upper and lower back pain -refill her tramadol for 3 times daily dosing for the next 90 days.   Also recommended that she do some home stretches and exercises.  Recommend that she go on YouTube and look at [facility] [place] and [facility] for low back in addition to her chiropractic care.

## 2018-10-13 ENCOUNTER — Other Ambulatory Visit: Payer: Self-pay | Admitting: Family Medicine

## 2018-10-13 DIAGNOSIS — Z1231 Encounter for screening mammogram for malignant neoplasm of breast: Secondary | ICD-10-CM

## 2018-10-22 ENCOUNTER — Ambulatory Visit (HOSPITAL_COMMUNITY): Payer: Self-pay

## 2018-11-05 ENCOUNTER — Other Ambulatory Visit: Payer: Self-pay

## 2018-11-05 ENCOUNTER — Ambulatory Visit
Admission: RE | Admit: 2018-11-05 | Discharge: 2018-11-05 | Disposition: A | Discharge status: Home / Self Care | Payer: No Typology Code available for payment source | Source: Ambulatory Visit | Attending: Family Medicine | Admitting: Family Medicine

## 2018-11-05 DIAGNOSIS — Z1231 Encounter for screening mammogram for malignant neoplasm of breast: Secondary | ICD-10-CM

## 2018-11-17 DIAGNOSIS — E7800 Pure hypercholesterolemia, unspecified: Secondary | ICD-10-CM | POA: Insufficient documentation

## 2019-01-05 ENCOUNTER — Telehealth (INDEPENDENT_AMBULATORY_CARE_PROVIDER_SITE_OTHER): Payer: Self-pay | Admitting: Family Medicine

## 2019-01-05 ENCOUNTER — Encounter: Payer: Self-pay | Admitting: Family Medicine

## 2019-01-05 VITALS — Ht 64.0 in

## 2019-01-05 DIAGNOSIS — Z13220 Encounter for screening for lipoid disorders: Secondary | ICD-10-CM

## 2019-01-05 DIAGNOSIS — R232 Flushing: Secondary | ICD-10-CM

## 2019-01-05 DIAGNOSIS — F902 Attention-deficit hyperactivity disorder, combined type: Secondary | ICD-10-CM

## 2019-01-05 DIAGNOSIS — R7309 Other abnormal glucose: Secondary | ICD-10-CM

## 2019-01-05 MED ORDER — AMPHETAMINE-DEXTROAMPHETAMINE 10 MG PO TABS: 10.0000 mg | ORAL_TABLET | Freq: Every day | ORAL | 0 refills | Status: DC

## 2019-01-05 NOTE — Progress Notes (Signed)
Virtual Visit via Video Note  I connected with Kelli Tyler on 01/05/19 at  1:00 PM EDT by a video enabled telemedicine application and verified that I am speaking with the correct person using two identifiers.   I discussed the limitations of evaluation and management by telemedicine and the availability of in person appointments. The patient expressed understanding and agreed to proceed.  Pt was at home and I was in my office for the virtual visit.     Subjective:    CC: ADHD and hot flashes  HPI: ADHD - Reports symptoms are well controlled on current regimen. Denies any problems with insomnia, chest pain, palpitations, or SOB.    F/U hotflashes. - she is on Effexor 75mg . Says it is working well. Happy with her regimen.   She is out of work right now and sys she has been trying to file for unemployment but not having any luck. Doesn't have health insurance right now.   Past medical history, Surgical history, Family history not pertinant except as noted below, Social history, Allergies, and medications have been entered into the medical record, reviewed, and corrections made.   Review of Systems: No fevers, chills, night sweats, weight loss, chest pain, or shortness of breath.   Objective:    General: Speaking clearly in complete sentences without any shortness of breath.  Alert and oriented x3.  Normal judgment. No apparent acute distress.  Well groomed.     Impression and Recommendations:   ADHD - Well controlled. Continue current regimen. Follow up in  6 monnths. Meds refilled.    Hot flashes - Well controlled. Continue current regimen. Follow up in  6-12 months.    Abnormal glucose - check A1C. Glucose was elevated on labs from last year. She doesn't have insurance right now but will check later this year.      I discussed the assessment and treatment plan with the patient. The patient was provided an opportunity to ask questions and all were answered. The patient agreed  with the plan and demonstrated an understanding of the instructions.   The patient was advised to call back or seek an in-person evaluation if the symptoms worsen or if the condition fails to improve as anticipated.   Nani Gasser, MD

## 2019-01-05 NOTE — Progress Notes (Signed)
Pt reports that the Effexor is really helping her w/the hot flashes.  Laureen Ochs, Viann Shove, CMA

## 2019-01-07 ENCOUNTER — Ambulatory Visit: Payer: Self-pay | Admitting: Family Medicine

## 2019-02-09 ENCOUNTER — Other Ambulatory Visit: Payer: Self-pay | Admitting: Family Medicine

## 2019-03-31 ENCOUNTER — Other Ambulatory Visit: Payer: Self-pay | Admitting: Family Medicine

## 2019-05-12 ENCOUNTER — Other Ambulatory Visit: Payer: Self-pay | Admitting: Family Medicine

## 2019-06-09 ENCOUNTER — Other Ambulatory Visit: Payer: Self-pay | Admitting: Family Medicine

## 2019-07-28 ENCOUNTER — Other Ambulatory Visit: Payer: Self-pay

## 2019-07-28 ENCOUNTER — Ambulatory Visit (INDEPENDENT_AMBULATORY_CARE_PROVIDER_SITE_OTHER): Payer: Self-pay

## 2019-07-28 ENCOUNTER — Other Ambulatory Visit: Payer: Self-pay | Admitting: General Practice

## 2019-07-28 DIAGNOSIS — R52 Pain, unspecified: Secondary | ICD-10-CM

## 2019-08-09 ENCOUNTER — Telehealth: Payer: Self-pay | Admitting: Family Medicine

## 2019-08-09 DIAGNOSIS — G8929 Other chronic pain: Secondary | ICD-10-CM

## 2019-08-09 NOTE — Telephone Encounter (Signed)
Please call patient and let her know that I definitely think we should move forward with formal physical therapy.  And then lets get her to follow-up with Dr. Darene Lamer in about 3 or 4 weeks to discuss additional treatment options.

## 2019-08-09 NOTE — Telephone Encounter (Signed)
Patient called and is still seeing the Chiropractor and she reports that " I feel some relief in my mid to lower back for a day or two after the appointment and then the pain is worse. The tramadol is not helping with the pain". She is wanting to know if she will need to go another route for pain management or does she need a follow up to talk about other options such at Physical therapy or other medications for her pain. Please advise.

## 2019-08-10 NOTE — Telephone Encounter (Signed)
Called and left pt msg advising referral for PT has been placed and to follow up with Dr T after about 3-4 weeks of physical therapy

## 2019-08-19 ENCOUNTER — Ambulatory Visit (INDEPENDENT_AMBULATORY_CARE_PROVIDER_SITE_OTHER): Payer: Self-pay | Admitting: Physical Therapy

## 2019-08-19 ENCOUNTER — Other Ambulatory Visit: Payer: Self-pay

## 2019-08-19 ENCOUNTER — Encounter: Payer: Self-pay | Admitting: Physical Therapy

## 2019-08-19 DIAGNOSIS — M6281 Muscle weakness (generalized): Secondary | ICD-10-CM

## 2019-08-19 DIAGNOSIS — M5441 Lumbago with sciatica, right side: Secondary | ICD-10-CM

## 2019-08-19 DIAGNOSIS — G8929 Other chronic pain: Secondary | ICD-10-CM

## 2019-08-19 DIAGNOSIS — M546 Pain in thoracic spine: Secondary | ICD-10-CM

## 2019-08-19 NOTE — Therapy (Signed)
[place], [place], [address] Phone: [phone]   Fax:  [phone]  Physical Therapy Evaluation  Patient Details  Name: Tanysha Quant MRN: [id number] Date of Birth: 1966/02/29 Referring Provider (PT): Beatrice Lecher, MD   Encounter Date: 08/19/2019  PT End of Session - 08/19/19 0823    Visit Number  1    Number of Visits  13    Date for PT Re-Evaluation  10/15/19    Authorization Type  Self Pay- pt wishing to vary frequency due to financial restraints    PT Start Time  0800    PT Stop Time  0845    PT Time Calculation (min)  45 min    Activity Tolerance  Patient tolerated treatment well    Behavior During Therapy  [facility] for tasks assessed/performed       Past Medical History:  Diagnosis Date  . ADHD   . Back pain   . Restless leg     Past Surgical History:  Procedure Laterality Date  . ABLATION    . CHOLECYSTECTOMY    . NASAL FRACTURE SURGERY    . TUBAL LIGATION      There were no vitals filed for this visit.   Subjective Assessment - 08/19/19 0815    Subjective  Pt arriving to therpay reporting back pain that has been going on for years but over the last year has progressively worsened. Pt reporting pain radiating down R LE into toes. Pt even reporting finger tips and toes going numb. 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 Pt reporting walking helps, but sitting increases her pain. Pt reporting driving makes her R sided pain worse which radiates down her R LE.    Pertinent History  ADHD, arthrits, HTN,    Limitations  Sitting;Standing;House hold activities;Other (comment)    How long can you sit comfortably?  15 minutes    How long can you stand comfortably?  10 minutes    How long can you walk comfortably?  Walking helps    Patient Stated Goals  "I want to stop hurting and work without pain," "I don't want surgery"    Currently in Pain?  Yes    Pain Score   5     Pain Location  Back    Pain Orientation  Lower;Mid    Pain Descriptors / Indicators  Aching    Pain Type  Chronic pain    Pain Radiating Towards  radiates down R LE    Pain Onset  More than a month ago    Pain Frequency  Constant    Aggravating Factors   sitting    Pain Relieving Factors  walking, lying down, tramadol         [facility] PT Assessment - 08/19/19 0001      Assessment   Medical Diagnosis  chronic upper back pain M54.9, Low back pain    Referring Provider (PT)  Beatrice Lecher, MD    Onset Date/Surgical Date  --   years ago worsening over the last year   Hand Dominance  Right    Prior Therapy  no, pt reports going to chiropractor which helped short term      Precautions   Precautions  None      Restrictions   Weight Bearing Restrictions  No      Balance Screen   Has the patient fallen in the past 6 months  No  Is the patient reluctant to leave their home because of a fear of falling?   No      Home Environment   Living Environment  Private residence    Type of Home  House    Home Access  Stairs to enter    Entrance Stairs-Number of Steps  7    Entrance Stairs-Rails  None      Prior Function   Level of Independence  Independent    Vocation  Full time employment    Vocation Requirements  waitress      Cognition   Overall Cognitive Status  Within Functional Limits for tasks assessed      Observation/Other Assessments   Focus on Therapeutic Outcomes (FOTO)   52% limitation      Posture/Postural Control   Posture/Postural Control  Postural limitations    Postural Limitations  Rounded Shoulders;Forward head;Increased thoracic kyphosis      ROM / Strength   AROM / PROM / Strength  AROM;Strength      AROM   Overall AROM   Deficits    AROM Assessment Site  Lumbar    Lumbar Flexion  70   increased pain with flexion   Lumbar Extension  30    Lumbar - Right Side Bend  28   tightness noted   Lumbar - Left Side Bend  22   tightness noted    Lumbar - Right Rotation  WFL    Lumbar - Left Rotation  WFL      Strength   Overall Strength  Deficits    Strength Assessment Site  Hip;Knee    Right/Left Hip  Right;Left    Right Hip Flexion  4/5    Right Hip ABduction  4/5    Right Hip ADduction  4/5    Left Hip Flexion  4/5    Left Hip ABduction  4/5    Left Hip ADduction  4/5    Right/Left Knee  Right;Left    Right Knee Flexion  4+/5    Right Knee Extension  4+/5    Left Knee Flexion  4+/5    Left Knee Extension  4+/5      Flexibility   Soft Tissue Assessment /Muscle Length  yes    Hamstrings  L: 76 degrees, R: 80 degrees      Palpation   Palpation comment  TTP L3-S1 and lumbar paraspinals      Special Tests    Special Tests  Lumbar    Lumbar Tests  Straight Leg Raise      Straight Leg Raise   Findings  Negative    Side   Right    Comment  Neg on Left      Transfers   Five time sit to stand comments   16 seconds using UE support      Ambulation/Gait   Gait Pattern  Within Functional Limits      High Level Balance   High Level Balance Activites  --   SLS: R: 5 seconds, L: 6 seconds               Objective measurements completed on examination: See above findings.              PT Education - 08/19/19 1315    Education Details  HEP, PT POC    Person(s) Educated  Patient    Methods  Explanation;Demonstration    Comprehension  Verbalized understanding;Returned demonstration  PT Long Term Goals - 08/19/19 1344      PT LONG TERM GOAL #1   Title  Pt will be independent in her HEP and progression.    Baseline  initial HEP issued on 08/19/2019    Time  6    Period  Weeks    Status  New    Target Date  09/30/19      PT LONG TERM GOAL #2   Title  Pt will be able to increase bilateral LE strength to 5/5 in hips and knees in order to improve functional mobility.    Baseline  4/5 see flowsheets    Time  6    Period  Weeks    Status  New    Target Date  09/30/19      PT  LONG TERM GOAL #3   Title  Pt will be able to tolerate sitting in a car with pain </= 2/10 for 2 hours at a time.    Baseline  pt reporting pain can increase to 9/10 when driving to see family    Time  6    Period  Weeks    Status  New    Target Date  09/30/19      PT LONG TERM GOAL #4   Title  Pt will improve her FOTO score from 52% limitation to </= 42% limitation.    Time  6    Period  Weeks    Status  New    Target Date  09/30/19      PT LONG TERM GOAL #5   Title  Pt will be able to bend to pick up a bag of groceries off the floor and lift to counter height with pain </= 2/10.    Baseline  pt unable to pick objects off the floor due to increased pain    Time  6    Period  Weeks    Status  New             Plan - 08/19/19 [id number]    Clinical Impression Statement  Pt presenting today reporting mid and low back pain that has been going on for 5-6 years and worsening over the last year. Pt reporting her low back pain is worse than her mid back. Pt with decreased strength in bilateral LE's hips and knees. Pt with increased pain with trunk flexion. Pt reporting no pain with extension. Pt with negative SLR bilaterally. Skilled PT needed to address pt's impairments with the below interventions.    Personal Factors and Comorbidities  Comorbidity 1    Comorbidities  ADHD    Examination-Activity Limitations  Dressing;Stairs;Squat;Sit;Stand    Stability/Clinical Decision Making  Stable/Uncomplicated    Clinical Decision Making  Low    Rehab Potential  Good    PT Frequency  2x / week   pt limited financially and can't committ to 2x/week   PT Duration  6 weeks    PT Treatment/Interventions  ADLs/Self Care Home Management;Cryotherapy;Electrical Stimulation;Iontophoresis 4mg /ml Dexamethasone;Traction;Ultrasound;Gait training;Stair training;Functional mobility training;Therapeutic activities;Therapeutic exercise;Patient/family education;Balance training;Neuromuscular re-education;Manual  techniques;Taping;Dry needling    PT Next Visit Plan  Lumbar and thoracic stretching, core strengthening, NuStep, STM to lumbar paraspinals, pirformis stretch, hamstring stretch, modalities as needed    PT Home Exercise Plan  [email or web address]    Consulted and Agree with Plan of Care  Patient       Patient will benefit from skilled therapeutic intervention in order to improve the following deficits and impairments:  Pain, Decreased activity tolerance, Decreased strength, Decreased range of motion, Postural dysfunction  Visit Diagnosis: Chronic bilateral low back pain with right-sided sciatica  Chronic bilateral thoracic back pain  Muscle weakness (generalized)     Problem List Patient Active Problem List   Diagnosis Date Noted  . Elevated LDL cholesterol level 11/17/2018  . Chronic upper back pain 03/14/2014  . Attention deficit hyperactivity disorder (ADHD) 03/30/2009  . BREAST MASS, RIGHT 03/27/2009  . RESTLESS LEG SYNDROME 12/14/2007  . Other specified disease of hair and hair follicles 12/14/2007  . GERD, SEVERE 01/27/2007  . DERMATITIS, OTHER ATOPIC 01/27/2007  . INSOMNIA 01/27/2007    Sharmon Leyden, PT 08/19/2019, 1:50 PM  [facility] [address] [place], [place], [address] Phone: [phone]   Fax:  [phone]  Name: Ladora Osterberg MRN: [id number] Date of Birth: 1966/02/25

## 2019-08-26 ENCOUNTER — Ambulatory Visit (INDEPENDENT_AMBULATORY_CARE_PROVIDER_SITE_OTHER): Payer: Self-pay | Admitting: Physical Therapy

## 2019-08-26 ENCOUNTER — Other Ambulatory Visit: Payer: Self-pay

## 2019-08-26 ENCOUNTER — Encounter: Payer: Self-pay | Admitting: Physical Therapy

## 2019-08-26 DIAGNOSIS — G8929 Other chronic pain: Secondary | ICD-10-CM

## 2019-08-26 DIAGNOSIS — M6281 Muscle weakness (generalized): Secondary | ICD-10-CM

## 2019-08-26 DIAGNOSIS — M5441 Lumbago with sciatica, right side: Secondary | ICD-10-CM

## 2019-08-26 DIAGNOSIS — M546 Pain in thoracic spine: Secondary | ICD-10-CM

## 2019-08-26 NOTE — Therapy (Addendum)
[place], [place], [address] Phone: [phone]   Fax:  [phone]  Physical Therapy Treatment and Discharge Summary  Patient Details  Name: Kelli Tyler MRN: [id number] Date of Birth: 12-Nov-1965 Referring Provider (PT): Beatrice Lecher, MD   Encounter Date: 08/26/2019  PT End of Session - 08/26/19 0846    Visit Number  2    Number of Visits  13    Date for PT Re-Evaluation  10/15/19    Authorization Type  Self Pay- pt wishing to vary frequency due to financial restraints    PT Start Time  0846    PT Stop Time  0928    PT Time Calculation (min)  42 min    Activity Tolerance  Patient tolerated treatment well    Behavior During Therapy  [facility] for tasks assessed/performed       Past Medical History:  Diagnosis Date  . ADHD   . Back pain   . Restless leg     Past Surgical History:  Procedure Laterality Date  . ABLATION    . CHOLECYSTECTOMY    . NASAL FRACTURE SURGERY    . TUBAL LIGATION      There were no vitals filed for this visit.  Subjective Assessment - 08/26/19 0846    Subjective  Patient presents with no pain due to taking Tramadol but 4-5/10 in the morning.    Pertinent History  ADHD, arthrits, HTN,    Limitations  Sitting;Standing;House hold activities;Other (comment)    Patient Stated Goals  "I want to stop hurting and work without pain," "I don't want surgery"                       [place] Adult PT Treatment/Exercise - 08/26/19 0001      Exercises   Exercises  Lumbar;Knee/Hip      Lumbar Exercises: Stretches   Other Lumbar Stretch Exercise  "7" stretch at sink attempted but painful to low back.     Other Lumbar Stretch Exercise  standing ext x 4      Lumbar Exercises: Supine   Ab Set  5 reps;5 seconds    Clam  10 reps    Heel Slides  10 reps    Bent Knee Raise  10 reps    Bent Knee Raise Limitations  also up/up/down/down    Dead Bug  10 reps    Dead Bug Limitations  no arms      Lumbar Exercises: Prone   Single Arm Raise  Right;Left;5 reps;5 seconds    Straight Leg Raise  10 reps;5 seconds    Opposite Arm/Leg Raise  Right arm/Left leg;Left arm/Right leg;5 reps;5 seconds      Knee/Hip Exercises: Stretches   Other Knee/Hip Stretches  LTR 10 sec hold x 5 ea             PT Education - 08/26/19 1721    Education Details  HEP progressed/modified    Person(s) Educated  Patient    Methods  Explanation;Demonstration;Handout    Comprehension  Verbalized understanding;Returned demonstration          PT Long Term Goals - 08/19/19 1344      PT LONG TERM GOAL #1   Title  Pt will be independent in her HEP and progression.    Baseline  initial HEP issued on 08/19/2019    Time  6    Period  Weeks    Status  New    Target Date  09/30/19      PT LONG TERM GOAL #2   Title  Pt will be able to increase bilateral LE strength to 5/5 in hips and knees in order to improve functional mobility.    Baseline  4/5 see flowsheets    Time  6    Period  Weeks    Status  New    Target Date  09/30/19      PT LONG TERM GOAL #3   Title  Pt will be able to tolerate sitting in a car with pain </= 2/10 for 2 hours at a time.    Baseline  pt reporting pain can increase to 9/10 when driving to see family    Time  6    Period  Weeks    Status  New    Target Date  09/30/19      PT LONG TERM GOAL #4   Title  Pt will improve her FOTO score from 52% limitation to </= 42% limitation.    Time  6    Period  Weeks    Status  New    Target Date  09/30/19      PT LONG TERM GOAL #5   Title  Pt will be able to bend to pick up a bag of groceries off the floor and lift to counter height with pain </= 2/10.    Baseline  pt unable to pick objects off the floor due to increased pain    Time  6    Period  Weeks    Status  New            Plan - 08/26/19 1721    Clinical Impression Statement  Patient continues to c/o low back pain with  prolonged standing and walking at work. She did very well with core strengthening today and HEP was progressed. Most difficulty with prone TE. She may benefit from DN due to chronic nature of pain.    PT Treatment/Interventions  ADLs/Self Care Home Management;Cryotherapy;Electrical Stimulation;Iontophoresis 53m/ml Dexamethasone;Traction;Ultrasound;Gait training;Stair training;Functional mobility training;Therapeutic activities;Therapeutic exercise;Patient/family education;Balance training;Neuromuscular re-education;Manual techniques;Taping;Dry needling    PT Next Visit Plan  continue core strength, flexibility, DN prn and possibly traction    PT Home Exercise Plan  [email or web address]    Consulted and Agree with Plan of Care  Patient       Patient will benefit from skilled therapeutic intervention in order to improve the following deficits and impairments:  Pain, Decreased activity tolerance, Decreased strength, Decreased range of motion, Postural dysfunction  Visit Diagnosis: Chronic bilateral low back pain with right-sided sciatica  Chronic bilateral thoracic back pain  Muscle weakness (generalized)     Problem List Patient Active Problem List   Diagnosis Date Noted  . Elevated LDL cholesterol level 11/17/2018  . Chronic upper back pain 03/14/2014  . Attention deficit hyperactivity disorder (ADHD) 03/30/2009  . BREAST MASS, RIGHT 03/27/2009  . RESTLESS LEG SYNDROME 12/14/2007  . Other specified disease of hair and hair follicles 038/05/1750 . GERD, SEVERE 01/27/2007  . DERMATITIS, OTHER ATOPIC 01/27/2007  . INSOMNIA 01/27/2007    JMadelyn FlavorsPT 08/26/2019, 5:26 PM  [facility] [address]Phone: [phone]  Fax:  [phone] Name: CCraig WisnewskiMRN: [id number] of Birth: [birth year] PHYSICAL THERAPY DISCHARGE SUMMARY  Visits from Start of Care: 2  Current functional level related to  goals / functional outcomes:  Unable to assess   Remaining deficits: Unable to assess   Education / Equipment: HEP  Plan: Patient agrees to discharge.  Patient goals were not met. Patient is being discharged due to financial reasons.  ?????     Madelyn Flavors, PT 11/10/19 3:21 PM  [facility] [place], [place] [address]  [phone] (office) [phone] (fax)

## 2019-08-26 NOTE — Patient Instructions (Signed)
Access Code: [email or web address]  URL: [email or web address]  Date: 08/26/2019  Prepared by: Raynelle Fanning Lorely Bubb   Exercises Supine Transversus Abdominis Bracing - Hands on Stomach - 10 reps - 10 hold - 1x daily - 7x weekly Supine Lower Trunk Rotation - 10 reps - 20 seconds hold - 2x daily - 7x weekly Prone Alternating Arm and Leg Lifts - 10 reps - 3 sets - 5 sec hold                            - 1x daily - 7x weekly Prone Press Up - 10 reps - 2-3 sets - 2x daily - 7x weekly Supine Transversus Abdominis Bracing with Leg Extension - 10 reps - 2 sets - 1x daily - 7x weekly

## 2019-09-02 ENCOUNTER — Encounter: Payer: Self-pay | Admitting: Physical Therapy

## 2019-10-12 LAB — HEMOGLOBIN A1C: Hemoglobin A1C: 5.7

## 2019-10-12 LAB — CBC AND DIFFERENTIAL
HCT: 41 (ref 36–46)
Hemoglobin: 13.6 (ref 12.0–16.0)
Platelets: 357 (ref 150–399)
WBC: 10.7

## 2019-10-12 LAB — LIPID PANEL
Cholesterol: 196 (ref 0–200)
HDL: 68 (ref 35–70)
LDL Cholesterol: 112
Triglycerides: 91 (ref 40–160)

## 2019-10-12 LAB — COMPREHENSIVE METABOLIC PANEL
Albumin: 4.7 (ref 3.5–5.0)
Calcium: 10.3 (ref 8.7–10.7)
Globulin: 2.5

## 2019-10-12 LAB — HEPATIC FUNCTION PANEL
ALT: 25 (ref 7–35)
AST: 26 (ref 13–35)
Alkaline Phosphatase: 125 (ref 25–125)
Bilirubin, Total: 0.3

## 2019-10-12 LAB — BASIC METABOLIC PANEL
BUN: 13 (ref 4–21)
CO2: 27 — AB (ref 13–22)
CO2: 27 — AB (ref 13–22)
Chloride: 100 (ref 99–108)
Creatinine: 0.7 (ref 0.5–1.1)
Glucose: 93
Potassium: 5.1 (ref 3.4–5.3)
Sodium: 141 (ref 137–147)

## 2019-11-04 ENCOUNTER — Other Ambulatory Visit: Payer: Self-pay | Admitting: Family Medicine

## 2019-11-11 ENCOUNTER — Ambulatory Visit (INDEPENDENT_AMBULATORY_CARE_PROVIDER_SITE_OTHER): Payer: Self-pay | Admitting: Family Medicine

## 2019-11-11 ENCOUNTER — Other Ambulatory Visit: Payer: Self-pay

## 2019-11-11 ENCOUNTER — Other Ambulatory Visit: Payer: Self-pay | Admitting: Family Medicine

## 2019-11-11 ENCOUNTER — Encounter: Payer: Self-pay | Admitting: Family Medicine

## 2019-11-11 VITALS — BP 130/87 | HR 67 | Ht 64.0 in | Wt 187.0 lb

## 2019-11-11 DIAGNOSIS — F902 Attention-deficit hyperactivity disorder, combined type: Secondary | ICD-10-CM

## 2019-11-11 DIAGNOSIS — R635 Abnormal weight gain: Secondary | ICD-10-CM

## 2019-11-11 DIAGNOSIS — R0981 Nasal congestion: Secondary | ICD-10-CM

## 2019-11-11 DIAGNOSIS — F321 Major depressive disorder, single episode, moderate: Secondary | ICD-10-CM

## 2019-11-11 DIAGNOSIS — R232 Flushing: Secondary | ICD-10-CM

## 2019-11-11 MED ORDER — VENLAFAXINE HCL ER 150 MG PO CP24: 150.0000 mg | ORAL_CAPSULE | Freq: Every day | ORAL | 1 refills | Status: DC

## 2019-11-11 MED ORDER — LISDEXAMFETAMINE DIMESYLATE 20 MG PO CAPS: 20.0000 mg | ORAL_CAPSULE | Freq: Every day | ORAL | 0 refills | Status: DC

## 2019-11-11 NOTE — Progress Notes (Signed)
[facility] [place] to have her [facility] done.

## 2019-11-11 NOTE — Progress Notes (Signed)
Established Patient Office Visit  Subjective:  Patient ID: Kelli Tyler, female    DOB: 1965-10-01  Age: 54 y.o. MRN: [id number]  CC:  Chief Complaint  Patient presents with  . ADHD  . Hot Flashes    HPI Kelli Tyler presents for   ADD - Reports symptoms are well controlled on current regime. Denies any problems with insomnia, chest pain, palpitations, or SOB.   She wants to change ot [name]. She feels like the Adderrall is making her crave sugar. She stopped it 2-3 months ago.  She felt like when she stopped the medication some of the sugar cravings actually went down.  She quit smoking in December.  She is doing well so far but has gained a little bit of weight since then.  And she just does not want to get it anymore.  F/U hotflashes -currently on Effexor 75 mg.  When I last saw her in May she felt like it was working well but now she is wanting to consider a dose adjustment.  She is having some left ear pain and nasal congestion. No eye pain.  She tried a sinus medication. NO fever, chills or sweat.  No GI sxs.    Past Medical History:  Diagnosis Date  . ADHD   . Back pain   . Restless leg     Past Surgical History:  Procedure Laterality Date  . ABLATION    . CHOLECYSTECTOMY    . NASAL FRACTURE SURGERY    . TUBAL LIGATION      Family History  Problem Relation Age of Onset  . Aneurysm Mother 22  . Stroke Mother 41  . Colon cancer Father 67  . Breast cancer Maternal Aunt   . Breast cancer Maternal Grandmother     Social History   Socioeconomic History  . Marital status: Married    Spouse name: Not on file  . Number of children: Not on file  . Years of education: Not on file  . Highest education level: Not on file  Occupational History  . Not on file  Tobacco Use  . Smoking status: Former Smoker    Packs/day: 1.00    Quit date: 07/29/2019    Years since quitting: 0.2  . Smokeless tobacco: Former Network engineer and Sexual Activity  . Alcohol use:  Yes  . Drug use: No  . Sexual activity: Not on file  Other Topics Concern  . Not on file  Social History Narrative  . Not on file   Social Determinants of Health   Financial Resource Strain:   . Difficulty of Paying Living Expenses:   Food Insecurity:   . Worried About Charity fundraiser in the Last Year:   . Arboriculturist in the Last Year:   Transportation Needs:   . Film/video editor (Medical):   Marland Kitchen Lack of Transportation (Non-Medical):   Physical Activity:   . Days of Exercise per Week:   . Minutes of Exercise per Session:   Stress:   . Feeling of Stress :   Social Connections:   . Frequency of Communication with Friends and Family:   . Frequency of Social Gatherings with Friends and Family:   . Attends Religious Services:   . Active Member of Clubs or Organizations:   . Attends Archivist Meetings:   Marland Kitchen Marital Status:   Intimate Partner Violence:   . Fear of Current or Ex-Partner:   . Emotionally Abused:   .  Physically Abused:   . Sexually Abused:     Outpatient Medications Prior to Visit  Medication Sig Dispense Refill  . aspirin EC 81 MG tablet Take 81 mg by mouth daily.    . traMADol (ULTRAM) 50 MG tablet TAKE 1 TABLET (50 MG TOTAL) BY MOUTH 3 (THREE) TIMES DAILY AS NEEDED. 270 tablet 0  . cyclobenzaprine (FLEXERIL) 10 MG tablet TAKE 1 TABLET (10 MG TOTAL) BY MOUTH AT BEDTIME AS NEEDED FOR MUSCLE SPASMS. 20 tablet 1  . amphetamine-dextroamphetamine (ADDERALL) 10 MG tablet Take 1 tablet (10 mg total) by mouth daily. 90 tablet 0  . venlafaxine XR (EFFEXOR-XR) 75 MG 24 hr capsule TAKE 1 CAPSULE BY MOUTH EVERY DAY 90 capsule 1   No facility-administered medications prior to visit.    Allergies  Allergen Reactions  . Wellbutrin [Bupropion] Other (See Comments)    Back Pain    ROS Review of Systems    Objective:    Physical Exam  Constitutional: She is oriented to person, place, and time. She appears well-developed and well-nourished.   HENT:  Head: Normocephalic and atraumatic.  Right Ear: External ear normal.  Left Ear: External ear normal.  Nose: Nose normal.  Mouth/Throat: Oropharynx is clear and moist.  TMs and canals are clear.   Eyes: Pupils are equal, round, and reactive to light. Conjunctivae and EOM are normal.  Neck: No thyromegaly present.  Cardiovascular: Normal rate, regular rhythm and normal heart sounds.  Pulmonary/Chest: Effort normal and breath sounds normal. She has no wheezes.  Musculoskeletal:     Cervical back: Neck supple.  Lymphadenopathy:    She has no cervical adenopathy.  Neurological: She is alert and oriented to person, place, and time.  Skin: Skin is warm and dry.  Psychiatric: She has a normal mood and affect. Her behavior is normal.    BP 130/87   Pulse 67   Ht 5\' 4"  (1.626 m)   Wt 187 lb (84.8 kg)   LMP 08/07/2015   SpO2 100%   BMI 32.10 kg/m  Wt Readings from Last 3 Encounters:  11/11/19 187 lb (84.8 kg)  10/08/18 174 lb (78.9 kg)  07/09/18 173 lb (78.5 kg)     Health Maintenance Due  Topic Date Due  . COLONOSCOPY  09/27/2018    There are no preventive care reminders to display for this patient.  Lab Results  Component Value Date   TSH 0.68 11/06/2017   Lab Results  Component Value Date   WBC 10.3 08/30/2015   HGB 13.0 08/30/2015   HCT 39.3 08/30/2015   MCV 89.5 08/30/2015   PLT 355 08/30/2015   Lab Results  Component Value Date   NA 139 11/06/2017   K 5.1 11/06/2017   CO2 28 11/06/2017   GLUCOSE 104 (H) 11/06/2017   BUN 11 11/06/2017   CREATININE 0.58 11/06/2017   BILITOT 0.4 11/06/2017   ALKPHOS 76 08/30/2015   AST 22 11/06/2017   ALT 27 11/06/2017   PROT 7.3 11/06/2017   ALBUMIN 4.3 08/30/2015   CALCIUM 10.0 11/06/2017   Lab Results  Component Value Date   CHOL 197 11/06/2017   Lab Results  Component Value Date   HDL 59 11/06/2017   Lab Results  Component Value Date   LDLCALC 118 (H) 11/06/2017   Lab Results  Component Value  Date   TRIG 97 11/06/2017   Lab Results  Component Value Date   CHOLHDL 3.3 11/06/2017   No results found for: HGBA1C  Assessment & Plan:   Problem List Items Addressed This Visit      Cardiovascular and Mediastinum   Hot flashes    On Effexor and feels like works well for sxs control.       Relevant Medications   aspirin EC 81 MG tablet     Other   Depression, major, single episode, moderate (HCC)    PHQ- 9 score of 12 today. No sig anxiety sxs.  Original on chest x-ray for applications but more recently expressing some depressive symptoms and she is interested in trying to increase her Effexor atorvastatin With her sleep.  Increase 250 mg daily.      Relevant Medications   venlafaxine XR (EFFEXOR-XR) 150 MG 24 hr capsule   Attention deficit hyperactivity disorder (ADHD) - Primary    Will change to [name] for 1 month and see if feels better.  We can f/u and adjust dose as needed.        Relevant Medications   lisdexamfetamine ([name]) 20 MG capsule    Other Visit Diagnoses    Abnormal weight gain       Nasal congestion         Nasal congestion, ear pain-exam globally reassuring.  Likely elderly lady.  Not improving then let us know where ear pain finally gets worse can please give [place] call back.  Meds ordered this encounter  Medications  . venlafaxine XR (EFFEXOR-XR) 150 MG 24 hr capsule    Sig: Take 1 capsule (150 mg total) by mouth daily.    Dispense:  30 capsule    Refill:  1  . lisdexamfetamine ([name]) 20 MG capsule    Sig: Take 1 capsule (20 mg total) by mouth daily.    Dispense:  30 capsule    Refill:  0    Follow-up: Return in about 6 weeks (around 12/23/2019) for Mood and ADD.    Nani Gasser, MD

## 2019-11-12 ENCOUNTER — Encounter: Payer: Self-pay | Admitting: Family Medicine

## 2019-11-12 DIAGNOSIS — F321 Major depressive disorder, single episode, moderate: Secondary | ICD-10-CM | POA: Insufficient documentation

## 2019-11-12 DIAGNOSIS — R232 Flushing: Secondary | ICD-10-CM | POA: Insufficient documentation

## 2019-11-12 DIAGNOSIS — Z8659 Personal history of other mental and behavioral disorders: Secondary | ICD-10-CM | POA: Insufficient documentation

## 2019-11-12 NOTE — Assessment & Plan Note (Addendum)
PHQ- 9 score of 12 today. No sig anxiety sxs.  Original on chest x-ray for applications but more recently expressing some depressive symptoms and she is interested in trying to increase her Effexor atorvastatin With her sleep.  Increase 250 mg daily.

## 2019-11-12 NOTE — Assessment & Plan Note (Signed)
On Effexor and feels like works well for sxs control.

## 2019-11-12 NOTE — Assessment & Plan Note (Signed)
Will change to [name] for 1 month and see if feels better.  We can f/u and adjust dose as needed.

## 2019-12-03 ENCOUNTER — Other Ambulatory Visit: Payer: Self-pay | Admitting: Family Medicine

## 2019-12-03 DIAGNOSIS — F321 Major depressive disorder, single episode, moderate: Secondary | ICD-10-CM

## 2019-12-30 ENCOUNTER — Telehealth (INDEPENDENT_AMBULATORY_CARE_PROVIDER_SITE_OTHER): Payer: Self-pay | Admitting: Family Medicine

## 2019-12-30 ENCOUNTER — Other Ambulatory Visit: Payer: Self-pay | Admitting: Family Medicine

## 2019-12-30 ENCOUNTER — Other Ambulatory Visit: Payer: Self-pay

## 2019-12-30 ENCOUNTER — Encounter: Payer: Self-pay | Admitting: Family Medicine

## 2019-12-30 VITALS — BP 117/69 | HR 86 | Temp 98.0°F | Ht 64.0 in | Wt 194.0 lb

## 2019-12-30 DIAGNOSIS — R232 Flushing: Secondary | ICD-10-CM

## 2019-12-30 DIAGNOSIS — F902 Attention-deficit hyperactivity disorder, combined type: Secondary | ICD-10-CM

## 2019-12-30 DIAGNOSIS — F321 Major depressive disorder, single episode, moderate: Secondary | ICD-10-CM

## 2019-12-30 MED ORDER — AMPHETAMINE-DEXTROAMPHET ER 15 MG PO CP24: 15.0000 mg | ORAL_CAPSULE | ORAL | 0 refills | Status: DC

## 2019-12-30 MED ORDER — GABAPENTIN 100 MG PO CAPS: 100.0000 mg | ORAL_CAPSULE | Freq: Every day | ORAL | 3 refills | Status: DC

## 2019-12-30 NOTE — Progress Notes (Signed)
Virtual Visit via Video Note  I connected with Kelli Tyler on 12/30/19 at  9:50 AM EDT by a video enabled telemedicine application and verified that I am speaking with the correct person using two identifiers.   I discussed the limitations of evaluation and management by telemedicine and the availability of in person appointments. The patient expressed understanding and agreed to proceed.  Subjective:    CC: Follow-up recent medication changes.  HPI:  Follow-up ADD.  Saw her in March she had discontinued Adderall because felt it was making her crave sugar so we decided to switch her to [name].  Unfortunately could not get the [name] covered as she has no health insurance.  She would like to consider going back to the Adderall but may be try a higher dose.  Hot flashes-discussed options and decided to increase her Effexor from 75 mg to 150 mg because she was starting to notice an increase in symptoms.  She says that the increase in the Effexor really has not helped her hot flashes but she feels like it has helped with her anxiety that she was experiencing.  Past medical history, Surgical history, Family history not pertinant except as noted below, Social history, Allergies, and medications have been entered into the medical record, reviewed, and corrections made.   Review of Systems: No fevers, chills, night sweats, weight loss, chest pain, or shortness of breath.   Objective:    General: Speaking clearly in complete sentences without any shortness of breath.  Alert and oriented x3.  Normal judgment. No apparent acute distress.    Impression and Recommendations:    Hot flashes No significant improvement on increased dose of Effexor.  We discussed other nonhormonal options such as gabapentin so we can try this at bedtime since her hot flashes are more bothersome at night.  She also gets pins and tingling sensation in her feet for which she takes her Flexeril at night so that the  gabapentin may be helpful.  Did encourage her to discontinue her Flexeril while trying the gabapentin.  Attention deficit hyperactivity disorder (ADHD) Removed [name] her medication list that she was unable to afford the medication to try it.  We can maybe always consider in the future.  For now plan to switch to [name].  Depression, major, single episode, moderate (HCC) Doing much better particularly with her increased anxiety lately on the increased dose of Effexor.   Time spent in encounter 25 minutes  I discussed the assessment and treatment plan with the patient. The patient was provided an opportunity to ask questions and all were answered. The patient agreed with the plan and demonstrated an understanding of the instructions.   The patient was advised to call back or seek an in-person evaluation if the symptoms worsen or if the condition fails to improve as anticipated.   Nani Gasser, MD

## 2019-12-30 NOTE — Assessment & Plan Note (Signed)
No significant improvement on increased dose of Effexor.  We discussed other nonhormonal options such as gabapentin so we can try this at bedtime since her hot flashes are more bothersome at night.  She also gets pins and tingling sensation in her feet for which she takes her Flexeril at night so that the gabapentin may be helpful.  Did encourage her to discontinue her Flexeril while trying the gabapentin.

## 2019-12-30 NOTE — Progress Notes (Signed)
Never filled [name] because of cost. She doesn't currently have pharmaceutical insurance and has to use the GoodRx card to obtain her prescriptions.  She would like to restart [name] and would like to try a higher dosage than the 10 mg.

## 2019-12-30 NOTE — Assessment & Plan Note (Signed)
Doing much better particularly with her increased anxiety lately on the increased dose of Effexor.

## 2019-12-30 NOTE — Assessment & Plan Note (Signed)
Removed [name] her medication list that she was unable to afford the medication to try it.  We can maybe always consider in the future.  For now plan to switch to [name].

## 2019-12-31 MED ORDER — TRAMADOL HCL 50 MG PO TABS: 50.0000 mg | ORAL_TABLET | Freq: Three times a day (TID) | ORAL | 0 refills | Status: DC | PRN

## 2019-12-31 NOTE — Telephone Encounter (Signed)
Karin Golden requesting med refill for tramadol. Last written on 06/09/2019.

## 2020-01-03 MED ORDER — TRAMADOL HCL 50 MG PO TABS: 50.0000 mg | ORAL_TABLET | Freq: Three times a day (TID) | ORAL | 0 refills | Status: DC | PRN

## 2020-01-03 NOTE — Addendum Note (Signed)
Addended by: Nani Gasser D on: 01/03/2020 10:28 AM   Modules accepted: Orders

## 2020-01-03 NOTE — Telephone Encounter (Signed)
Medication resent as the original failed on 12/31/2019

## 2020-01-31 ENCOUNTER — Telehealth: Payer: Self-pay

## 2020-01-31 DIAGNOSIS — F902 Attention-deficit hyperactivity disorder, combined type: Secondary | ICD-10-CM

## 2020-01-31 MED ORDER — AMPHETAMINE-DEXTROAMPHET ER 20 MG PO CP24: 20.0000 mg | ORAL_CAPSULE | ORAL | 0 refills | Status: DC

## 2020-01-31 NOTE — Telephone Encounter (Signed)
Patient due for Adderall refill. Currently on 15 mg but wanting to increase to 20 mg, states she has already discussed this possible change with Dr Linford Arnold.  Last appt 12/30/19  Last RF 12/30/19  Wants RF to go to [facility], this increased dose has been pended

## 2020-01-31 NOTE — Telephone Encounter (Signed)
OK, new rx sent to pharmacy. She will need to f/u in person in 1 month so we can check BP on medication since we have doubled her original dose.

## 2020-02-01 NOTE — Telephone Encounter (Signed)
Left message advising of recommendations.  

## 2020-02-02 ENCOUNTER — Other Ambulatory Visit: Payer: Self-pay | Admitting: *Deleted

## 2020-02-02 MED ORDER — CYCLOBENZAPRINE HCL 10 MG PO TABS: 10.0000 mg | ORAL_TABLET | Freq: Every evening | ORAL | 0 refills | Status: DC | PRN

## 2020-02-24 ENCOUNTER — Telehealth: Payer: Self-pay | Admitting: Family Medicine

## 2020-03-03 ENCOUNTER — Other Ambulatory Visit: Payer: Self-pay

## 2020-03-03 ENCOUNTER — Encounter: Payer: Self-pay | Admitting: Family Medicine

## 2020-03-03 ENCOUNTER — Ambulatory Visit (INDEPENDENT_AMBULATORY_CARE_PROVIDER_SITE_OTHER): Payer: Self-pay | Admitting: Family Medicine

## 2020-03-03 VITALS — BP 136/79 | HR 79 | Ht 64.0 in | Wt 201.0 lb

## 2020-03-03 DIAGNOSIS — R7301 Impaired fasting glucose: Secondary | ICD-10-CM

## 2020-03-03 DIAGNOSIS — M5441 Lumbago with sciatica, right side: Secondary | ICD-10-CM

## 2020-03-03 DIAGNOSIS — G8929 Other chronic pain: Secondary | ICD-10-CM

## 2020-03-03 DIAGNOSIS — F902 Attention-deficit hyperactivity disorder, combined type: Secondary | ICD-10-CM

## 2020-03-03 DIAGNOSIS — R7309 Other abnormal glucose: Secondary | ICD-10-CM

## 2020-03-03 DIAGNOSIS — Z87891 Personal history of nicotine dependence: Secondary | ICD-10-CM

## 2020-03-03 LAB — POCT GLYCOSYLATED HEMOGLOBIN (HGB A1C): Hemoglobin A1C: 5.7 % — AB (ref 4.0–5.6)

## 2020-03-03 MED ORDER — DULOXETINE HCL 30 MG PO CPEP: 30.0000 mg | ORAL_CAPSULE | Freq: Every day | ORAL | 3 refills | Status: DC

## 2020-03-03 MED ORDER — CYCLOBENZAPRINE HCL 10 MG PO TABS: 10.0000 mg | ORAL_TABLET | Freq: Every evening | ORAL | 0 refills | Status: DC | PRN

## 2020-03-03 MED ORDER — AMPHETAMINE-DEXTROAMPHET ER 20 MG PO CP24: 20.0000 mg | ORAL_CAPSULE | ORAL | 0 refills | Status: DC

## 2020-03-03 NOTE — Progress Notes (Signed)
Established Patient Office Visit  Subjective:  Patient ID: Kelli Tyler, female    DOB: 1965/10/06  Age: 54 y.o. MRN: [id number]  CC:  Chief Complaint  Patient presents with  . ADHD    HPI Altha Sweitzer presents for   ADHD - Reports symptoms are well controlled on current regime. Denies any problems with insomnia, chest pain, palpitations, or SOB.  She feels like the 20 mg Adderall is actually working really well for her.   F/U Low back pain - she has been seeing a chiropractor and that has been helpful.  But more recently it is been more persistent.  She is to the point where she really would like to try something to help with her pain.  She does use Flexeril as well as tramadol.  She is already on gabapentin which mostly helps her restless leg.  We will originally referred her to outpatient physical therapy.  She did go for one session but unfortunately was unable to afford to return she does not currently have health insurance.  But she has continued to try to do the home exercises.  She quit smoking about 7 months ago.  Past Medical History:  Diagnosis Date  . ADHD   . Back pain   . Restless leg     Past Surgical History:  Procedure Laterality Date  . ABLATION    . CHOLECYSTECTOMY    . NASAL FRACTURE SURGERY    . TUBAL LIGATION      Family History  Problem Relation Age of Onset  . Aneurysm Mother 58  . Stroke Mother 20  . Colon cancer Father 30  . Breast cancer Maternal Aunt   . Breast cancer Maternal Grandmother     Social History   Socioeconomic History  . Marital status: Married    Spouse name: Not on file  . Number of children: Not on file  . Years of education: Not on file  . Highest education level: Not on file  Occupational History  . Not on file  Tobacco Use  . Smoking status: Former Smoker    Packs/day: 1.00    Quit date: 07/29/2019    Years since quitting: 0.6  . Smokeless tobacco: Former Engineer, water and Sexual Activity  . Alcohol use:  Yes  . Drug use: No  . Sexual activity: Not on file  Other Topics Concern  . Not on file  Social History Narrative  . Not on file   Social Determinants of Health   Financial Resource Strain:   . Difficulty of Paying Living Expenses:   Food Insecurity:   . Worried About Programme researcher, broadcasting/film/video in the Last Year:   . Barista in the Last Year:   Transportation Needs:   . Freight forwarder (Medical):   Marland Kitchen Lack of Transportation (Non-Medical):   Physical Activity:   . Days of Exercise per Week:   . Minutes of Exercise per Session:   Stress:   . Feeling of Stress :   Social Connections:   . Frequency of Communication with Friends and Family:   . Frequency of Social Gatherings with Friends and Family:   . Attends Religious Services:   . Active Member of Clubs or Organizations:   . Attends Banker Meetings:   Marland Kitchen Marital Status:   Intimate Partner Violence:   . Fear of Current or Ex-Partner:   . Emotionally Abused:   Marland Kitchen Physically Abused:   . Sexually Abused:  Outpatient Medications Prior to Visit  Medication Sig Dispense Refill  . aspirin EC 81 MG tablet Take 81 mg by mouth daily.    Marland Kitchen gabapentin (NEURONTIN) 100 MG capsule Take 1-2 capsules (100-200 mg total) by mouth at bedtime. 60 capsule 3  . traMADol (ULTRAM) 50 MG tablet Take 1 tablet (50 mg total) by mouth 3 (three) times daily as needed for severe pain. 90 tablet 0  . venlafaxine XR (EFFEXOR-XR) 150 MG 24 hr capsule TAKE 1 CAPSULE BY MOUTH EVERY DAY 90 capsule 1  . amphetamine-dextroamphetamine ([name]) 20 MG 24 hr capsule Take 1 capsule (20 mg total) by mouth every morning. 30 capsule 0  . cyclobenzaprine (FLEXERIL) 10 MG tablet Take 1 tablet (10 mg total) by mouth at bedtime as needed for muscle spasms. 30 tablet 0   No facility-administered medications prior to visit.    Allergies  Allergen Reactions  . Wellbutrin [Bupropion] Other (See Comments)    Back Pain    ROS Review of  Systems    Objective:    Physical Exam Constitutional:      Appearance: She is well-developed.  HENT:     Head: Normocephalic and atraumatic.  Cardiovascular:     Rate and Rhythm: Normal rate and regular rhythm.     Heart sounds: Normal heart sounds.  Pulmonary:     Effort: Pulmonary effort is normal.     Breath sounds: Normal breath sounds.  Skin:    General: Skin is warm and dry.  Neurological:     Mental Status: She is alert and oriented to person, place, and time.  Psychiatric:        Behavior: Behavior normal.     BP 136/79   Pulse 79   Ht 5\' 4"  (1.626 m)   Wt 201 lb (91.2 kg)   LMP 08/07/2015   SpO2 95%   BMI 34.50 kg/m  Wt Readings from Last 3 Encounters:  03/03/20 201 lb (91.2 kg)  12/30/19 194 lb (88 kg)  11/11/19 187 lb (84.8 kg)     Health Maintenance Due  Topic Date Due  . Hepatitis C Screening  Never done    There are no preventive care reminders to display for this patient.  Lab Results  Component Value Date   TSH 0.68 11/06/2017   Lab Results  Component Value Date   WBC 10.3 08/30/2015   HGB 13.0 08/30/2015   HCT 39.3 08/30/2015   MCV 89.5 08/30/2015   PLT 355 08/30/2015   Lab Results  Component Value Date   NA 139 11/06/2017   K 5.1 11/06/2017   CO2 28 11/06/2017   GLUCOSE 104 (H) 11/06/2017   BUN 11 11/06/2017   CREATININE 0.58 11/06/2017   BILITOT 0.4 11/06/2017   ALKPHOS 76 08/30/2015   AST 22 11/06/2017   ALT 27 11/06/2017   PROT 7.3 11/06/2017   ALBUMIN 4.3 08/30/2015   CALCIUM 10.0 11/06/2017   Lab Results  Component Value Date   CHOL 197 11/06/2017   Lab Results  Component Value Date   HDL 59 11/06/2017   Lab Results  Component Value Date   LDLCALC 118 (H) 11/06/2017   Lab Results  Component Value Date   TRIG 97 11/06/2017   Lab Results  Component Value Date   CHOLHDL 3.3 11/06/2017   Lab Results  Component Value Date   HGBA1C 5.7 (A) 03/03/2020      Assessment & Plan:   Problem List Items  Addressed This Visit  Endocrine   IFG (impaired fasting glucose)    A1c 5.7 today which is technically in the prediabetic range.  Discussed working on eating a low sugar and low carb diet and trying to get more active.  We will monitor this carefully and plan to recheck again in 6 months.        Nervous and Auditory   Chronic bilateral low back pain with right-sided sciatica    Continue home stretches and exercises.  Unfortunately without insurance will be difficult to get her in for any consistent and rigorous therapy.  She is finding some relief with the chiropractor.  We also discussed maybe a trial of Cymbalta.  Will start with 30 mg and see how this may work for her.  Did discuss expecting only up to a 30% improvement in pain control.  Continue with tramadol and Flexeril as needed.      Relevant Medications   amphetamine-dextroamphetamine ([name]) 20 MG 24 hr capsule   cyclobenzaprine (FLEXERIL) 10 MG tablet   DULoxetine (CYMBALTA) 30 MG capsule     Other   Former smoker    Congratulated her on quitting smoking just encouraged her to continue to work at it.  She quit approximately 7 months ago.      Attention deficit hyperactivity disorder (ADHD)    Well controlled. Continue current regimen. Follow up in  6 mo      Relevant Medications   amphetamine-dextroamphetamine ([name]) 20 MG 24 hr capsule    Other Visit Diagnoses    Abnormal glucose    -  Primary   Relevant Orders   POCT glycosylated hemoglobin (Hb A1C) (Completed)      Meds ordered this encounter  Medications  . amphetamine-dextroamphetamine ([name]) 20 MG 24 hr capsule    Sig: Take 1 capsule (20 mg total) by mouth every morning.    Dispense:  30 capsule    Refill:  0  . cyclobenzaprine (FLEXERIL) 10 MG tablet    Sig: Take 1 tablet (10 mg total) by mouth at bedtime as needed for muscle spasms.    Dispense:  90 tablet    Refill:  0  . DULoxetine (CYMBALTA) 30 MG capsule    Sig: Take 1  capsule (30 mg total) by mouth daily.    Dispense:  30 capsule    Refill:  3    Follow-up: Return in about 6 months (around 09/03/2020) for ADD.    Nani Gasser, MD

## 2020-03-07 ENCOUNTER — Encounter: Payer: Self-pay | Admitting: Family Medicine

## 2020-03-07 DIAGNOSIS — Z87891 Personal history of nicotine dependence: Secondary | ICD-10-CM | POA: Insufficient documentation

## 2020-03-07 DIAGNOSIS — R7301 Impaired fasting glucose: Secondary | ICD-10-CM | POA: Insufficient documentation

## 2020-03-07 DIAGNOSIS — G8929 Other chronic pain: Secondary | ICD-10-CM | POA: Insufficient documentation

## 2020-03-07 NOTE — Assessment & Plan Note (Signed)
Well controlled. Continue current regimen. Follow up in  6 mo  

## 2020-03-07 NOTE — Assessment & Plan Note (Signed)
Continue home stretches and exercises.  Unfortunately without insurance will be difficult to get her in for any consistent and rigorous therapy.  She is finding some relief with the chiropractor.  We also discussed maybe a trial of Cymbalta.  Will start with 30 mg and see how this may work for her.  Did discuss expecting only up to a 30% improvement in pain control.  Continue with tramadol and Flexeril as needed.

## 2020-03-07 NOTE — Assessment & Plan Note (Signed)
Congratulated her on quitting smoking just encouraged her to continue to work at it.  She quit approximately 7 months ago.

## 2020-03-07 NOTE — Assessment & Plan Note (Signed)
A1c 5.7 today which is technically in the prediabetic range.  Discussed working on eating a low sugar and low carb diet and trying to get more active.  We will monitor this carefully and plan to recheck again in 6 months.

## 2020-03-28 ENCOUNTER — Encounter: Payer: Self-pay | Admitting: Family Medicine

## 2020-04-05 ENCOUNTER — Other Ambulatory Visit: Payer: Self-pay

## 2020-04-05 DIAGNOSIS — F902 Attention-deficit hyperactivity disorder, combined type: Secondary | ICD-10-CM

## 2020-04-05 MED ORDER — AMPHETAMINE-DEXTROAMPHET ER 20 MG PO CP24
20.0000 mg | ORAL_CAPSULE | ORAL | 0 refills | Status: DC
Start: 2020-04-05 — End: 2020-04-26

## 2020-04-05 MED ORDER — AMPHETAMINE-DEXTROAMPHET ER 20 MG PO CP24: 20.0000 mg | ORAL_CAPSULE | ORAL | 0 refills | Status: DC

## 2020-04-05 NOTE — Telephone Encounter (Signed)
Kelli Tyler requests a refill on Adderall.

## 2020-04-05 NOTE — Telephone Encounter (Signed)
rx sent for Aug and Sept

## 2020-04-12 ENCOUNTER — Emergency Department (HOSPITAL_COMMUNITY): Payer: Self-pay

## 2020-04-12 ENCOUNTER — Other Ambulatory Visit: Payer: Self-pay

## 2020-04-12 ENCOUNTER — Emergency Department (HOSPITAL_COMMUNITY)
Admission: EM | Admit: 2020-04-12 | Discharge: 2020-04-12 | Disposition: A | Discharge status: Home / Self Care | Payer: Self-pay | Attending: Emergency Medicine | Admitting: Emergency Medicine

## 2020-04-12 ENCOUNTER — Encounter (HOSPITAL_COMMUNITY): Payer: Self-pay | Admitting: Emergency Medicine

## 2020-04-12 DIAGNOSIS — R42 Dizziness and giddiness: Secondary | ICD-10-CM | POA: Insufficient documentation

## 2020-04-12 DIAGNOSIS — U071 COVID-19: Secondary | ICD-10-CM

## 2020-04-12 DIAGNOSIS — Z87891 Personal history of nicotine dependence: Secondary | ICD-10-CM | POA: Insufficient documentation

## 2020-04-12 DIAGNOSIS — J1282 Pneumonia due to coronavirus disease 2019: Secondary | ICD-10-CM | POA: Insufficient documentation

## 2020-04-12 DIAGNOSIS — R0602 Shortness of breath: Secondary | ICD-10-CM | POA: Insufficient documentation

## 2020-04-12 DIAGNOSIS — Z7982 Long term (current) use of aspirin: Secondary | ICD-10-CM | POA: Insufficient documentation

## 2020-04-12 LAB — COMPREHENSIVE METABOLIC PANEL
ALT: 28 U/L (ref 0–44)
AST: 33 U/L (ref 15–41)
Albumin: 3.8 g/dL (ref 3.5–5.0)
Alkaline Phosphatase: 92 U/L (ref 38–126)
Anion gap: 13 (ref 5–15)
BUN: 18 mg/dL (ref 6–20)
CO2: 26 mmol/L (ref 22–32)
Calcium: 8.7 mg/dL — ABNORMAL LOW (ref 8.9–10.3)
Chloride: 97 mmol/L — ABNORMAL LOW (ref 98–111)
Creatinine, Ser: 0.96 mg/dL (ref 0.44–1.00)
GFR calc Af Amer: >60 mL/min (ref >60)
GFR calc non Af Amer: >60 mL/min (ref >60)
Glucose, Bld: 108 mg/dL — ABNORMAL HIGH (ref 70–99)
Potassium: 3.7 mmol/L (ref 3.5–5.1)
Sodium: 136 mmol/L (ref 135–145)
Total Bilirubin: 0.3 mg/dL (ref 0.3–1.2)
Total Protein: 7.3 g/dL (ref 6.5–8.1)

## 2020-04-12 LAB — FERRITIN: Ferritin: 265 ng/mL (ref 11–307)

## 2020-04-12 LAB — CBC WITH DIFFERENTIAL/PLATELET
Abs Immature Granulocytes: 0.03 10*3/uL (ref 0.00–0.07)
Basophils Absolute: 0 10*3/uL (ref 0.0–0.1)
Basophils Relative: 0 %
Eosinophils Absolute: 0 10*3/uL (ref 0.0–0.5)
Eosinophils Relative: 0 %
HCT: 41.7 % (ref 36.0–46.0)
Hemoglobin: 13.4 g/dL (ref 12.0–15.0)
Immature Granulocytes: 0 %
Lymphocytes Relative: 26 %
Lymphs Abs: 1.8 10*3/uL (ref 0.7–4.0)
MCH: 28.5 pg (ref 26.0–34.0)
MCHC: 32.1 g/dL (ref 30.0–36.0)
MCV: 88.7 fL (ref 80.0–100.0)
Monocytes Absolute: 0.5 10*3/uL (ref 0.1–1.0)
Monocytes Relative: 7 %
Neutro Abs: 4.7 10*3/uL (ref 1.7–7.7)
Neutrophils Relative %: 67 %
Platelets: 249 10*3/uL (ref 150–400)
RBC: 4.7 MIL/uL (ref 3.87–5.11)
RDW: 14.2 % (ref 11.5–15.5)
WBC: 7 10*3/uL (ref 4.0–10.5)
nRBC: 0 % (ref 0.0–0.2)

## 2020-04-12 LAB — LACTATE DEHYDROGENASE: LDH: 281 U/L — ABNORMAL HIGH (ref 98–192)

## 2020-04-12 LAB — D-DIMER, QUANTITATIVE: D-Dimer, Quant: 1.9 ug/mL-FEU — ABNORMAL HIGH (ref 0.00–0.50)

## 2020-04-12 LAB — C-REACTIVE PROTEIN: CRP: 9.6 mg/dL — ABNORMAL HIGH (ref <1.0)

## 2020-04-12 LAB — FIBRINOGEN: Fibrinogen: 538 mg/dL — ABNORMAL HIGH (ref 210–475)

## 2020-04-12 LAB — PROCALCITONIN: Procalcitonin: <0.1 ng/mL

## 2020-04-12 LAB — LACTIC ACID, PLASMA: Lactic Acid, Venous: 1.5 mmol/L (ref 0.5–1.9)

## 2020-04-12 LAB — TRIGLYCERIDES: Triglycerides: 103 mg/dL (ref <150)

## 2020-04-12 MED ORDER — PREDNISONE 10 MG (21) PO TBPK: ORAL_TABLET | ORAL | 0 refills | Status: DC

## 2020-04-12 MED ORDER — SODIUM CHLORIDE 0.9 % IV SOLN
1000.0000 mL | INTRAVENOUS | Status: DC
  Administered 2020-04-12: 1000 mL via INTRAVENOUS

## 2020-04-12 MED ORDER — DEXAMETHASONE SODIUM PHOSPHATE 10 MG/ML IJ SOLN
10.0000 mg | Freq: Once | INTRAMUSCULAR | Status: AC
  Administered 2020-04-12: 10 mg via INTRAVENOUS
  Filled 2020-04-12: qty 1

## 2020-04-12 MED ORDER — DOXYCYCLINE HYCLATE 100 MG PO CAPS: 100.0000 mg | ORAL_CAPSULE | Freq: Two times a day (BID) | ORAL | 0 refills | Status: DC

## 2020-04-12 MED ORDER — ALBUTEROL SULFATE HFA 108 (90 BASE) MCG/ACT IN AERS
1.0000 | INHALATION_SPRAY | RESPIRATORY_TRACT | Status: DC | PRN
  Filled 2020-04-12: qty 6.7

## 2020-04-12 MED ORDER — AEROCHAMBER Z-STAT PLUS/MEDIUM MISC: 1.0000 | Freq: Once | Status: DC

## 2020-04-12 MED ORDER — KETOROLAC TROMETHAMINE 30 MG/ML IJ SOLN
30.0000 mg | Freq: Once | INTRAMUSCULAR | Status: AC
  Administered 2020-04-12: 30 mg via INTRAVENOUS
  Filled 2020-04-12: qty 1

## 2020-04-12 MED ORDER — SODIUM CHLORIDE 0.9 % IV SOLN
500.0000 mg | Freq: Once | INTRAVENOUS | Status: AC
  Administered 2020-04-12: 500 mg via INTRAVENOUS
  Filled 2020-04-12: qty 500

## 2020-04-12 MED ORDER — IOHEXOL 350 MG/ML SOLN
100.0000 mL | Freq: Once | INTRAVENOUS | Status: AC | PRN
  Administered 2020-04-12: 100 mL via INTRAVENOUS

## 2020-04-12 MED ORDER — SODIUM CHLORIDE 0.9 % IV SOLN
1.0000 g | Freq: Once | INTRAVENOUS | Status: AC
  Administered 2020-04-12: 1 g via INTRAVENOUS
  Filled 2020-04-12: qty 10

## 2020-04-12 NOTE — ED Notes (Signed)
Pt. Documented in error see above note in chart. 

## 2020-04-12 NOTE — ED Provider Notes (Signed)
[facility]-EMERGENCY DEPT Provider Note   CSN: [id number] Arrival date & time: 04/12/20  1301     History Chief Complaint  Patient presents with  . covid positive  . Shortness of Breath  . Dizziness    Kelli Tyler is a 54 y.o. female.  Pt presents to the ED today with sob and dizziness with exertion.  Pt developed sx on 8/17.  She went to UC on 8/18 and tested + for Covid.  Pt has not been vaccinated.  Pt said she's developed more chills and sob since then.  She has not left the bed much because she's been weak.  She has a home O2 monitor and her O2 sats have dropped to 88%.         Past Medical History:  Diagnosis Date  . ADHD   . Back pain   . Restless leg     Patient Active Problem List   Diagnosis Date Noted  . Former smoker 03/07/2020  . Chronic bilateral low back pain with right-sided sciatica 03/07/2020  . IFG (impaired fasting glucose) 03/07/2020  . Depression, major, single episode, moderate (HCC) 11/12/2019  . Hot flashes 11/12/2019  . Elevated LDL cholesterol level 11/17/2018  . Chronic upper back pain 03/14/2014  . Attention deficit hyperactivity disorder (ADHD) 03/30/2009  . BREAST MASS, RIGHT 03/27/2009  . RESTLESS LEG SYNDROME 12/14/2007  . Other specified disease of hair and hair follicles 12/14/2007  . GERD, SEVERE 01/27/2007  . DERMATITIS, OTHER ATOPIC 01/27/2007  . INSOMNIA 01/27/2007    Past Surgical History:  Procedure Laterality Date  . ABLATION    . CHOLECYSTECTOMY    . NASAL FRACTURE SURGERY    . TUBAL LIGATION       OB History   No obstetric history on file.     Family History  Problem Relation Age of Onset  . Aneurysm Mother 64  . Stroke Mother 64  . Colon cancer Father 18  . Breast cancer Maternal Aunt   . Breast cancer Maternal Grandmother     Social History   Tobacco Use  . Smoking status: Former Smoker    Packs/day: 1.00    Quit date: 07/29/2019    Years since quitting: 0.7  . Smokeless  tobacco: Former Engineer, water Use Topics  . Alcohol use: Yes  . Drug use: No    Home Medications Prior to Admission medications   Medication Sig Start Date End Date Taking? Authorizing Provider  amphetamine-dextroamphetamine ([name]) 20 MG 24 hr capsule Take 1 capsule (20 mg total) by mouth every morning. 05/05/20   Agapito Games, MD  amphetamine-dextroamphetamine ([name]) 20 MG 24 hr capsule Take 1 capsule (20 mg total) by mouth every morning. 04/05/20   Agapito Games, MD  aspirin EC 81 MG tablet Take 81 mg by mouth daily.    [provider]  cyclobenzaprine (FLEXERIL) 10 MG tablet Take 1 tablet (10 mg total) by mouth at bedtime as needed for muscle spasms. 03/03/20   Agapito Games, MD  doxycycline (VIBRAMYCIN) 100 MG capsule Take 1 capsule (100 mg total) by mouth 2 (two) times daily. 04/12/20   Jacalyn Lefevre, MD  DULoxetine (CYMBALTA) 30 MG capsule Take 1 capsule (30 mg total) by mouth daily. 03/03/20   Agapito Games, MD  gabapentin (NEURONTIN) 100 MG capsule Take 1-2 capsules (100-200 mg total) by mouth at bedtime. 12/30/19   Agapito Games, MD  predniSONE (STERAPRED UNI-PAK 21 TAB) 10 MG (  21) TBPK tablet Take 6 tabs for 2 days, then 5 for 2 days, then 4 for 2 days, then 3 for 2 days, 2 for 2 days, then 1 for 2 days 04/12/20   Jacalyn LefevreHaviland, Janye Maynor, MD  traMADol (ULTRAM) 50 MG tablet Take 1 tablet (50 mg total) by mouth 3 (three) times daily as needed for severe pain. 01/03/20   Agapito GamesMetheney, Catherine D, MD  venlafaxine XR (EFFEXOR-XR) 150 MG 24 hr capsule TAKE 1 CAPSULE BY MOUTH EVERY DAY 12/03/19   Agapito GamesMetheney, Catherine D, MD    Allergies    Wellbutrin [bupropion]  Review of Systems   Review of Systems  Constitutional: Positive for fatigue.  Respiratory: Positive for shortness of breath.   Musculoskeletal: Positive for myalgias.  All other systems reviewed and are negative.   Physical Exam Updated Vital Signs BP 106/64   Pulse 88    Temp 98.1 F (36.7 C) (Oral)   Resp (!) 28   Ht 5\' 7"  (1.702 m)   Wt 88.5 kg   LMP 08/07/2015   SpO2 92%   BMI 30.54 kg/m   Physical Exam Vitals and nursing note reviewed.  Constitutional:      Appearance: She is well-developed.  HENT:     Head: Normocephalic and atraumatic.     Mouth/Throat:     Mouth: Mucous membranes are moist.     Pharynx: Oropharynx is clear.  Eyes:     Extraocular Movements: Extraocular movements intact.     Pupils: Pupils are equal, round, and reactive to light.  Cardiovascular:     Rate and Rhythm: Normal rate and regular rhythm.  Pulmonary:     Effort: Pulmonary effort is normal.     Breath sounds: Wheezing present.  Abdominal:     General: Bowel sounds are normal.     Palpations: Abdomen is soft.  Musculoskeletal:        General: Normal range of motion.     Cervical back: Normal range of motion and neck supple.  Skin:    General: Skin is warm.     Capillary Refill: Capillary refill takes less than 2 seconds.  Neurological:     General: No focal deficit present.     Mental Status: She is alert and oriented to person, place, and time.  Psychiatric:        Mood and Affect: Mood normal.        Behavior: Behavior normal.     ED Results / Procedures / Treatments   Labs (all labs ordered are listed, but only abnormal results are displayed) Labs Reviewed  COMPREHENSIVE METABOLIC PANEL - Abnormal; Notable for the following components:      Result Value   Chloride 97 (*)    Glucose, Bld 108 (*)    Calcium 8.7 (*)    All other components within normal limits  D-DIMER, QUANTITATIVE (NOT AT [facility]) - Abnormal; Notable for the following components:   D-Dimer, Quant 1.90 (*)    All other components within normal limits  LACTATE DEHYDROGENASE - Abnormal; Notable for the following components:   LDH 281 (*)    All other components within normal limits  FIBRINOGEN - Abnormal; Notable for the following components:   Fibrinogen 538 (*)    All other  components within normal limits  C-REACTIVE PROTEIN - Abnormal; Notable for the following components:   CRP 9.6 (*)    All other components within normal limits  CULTURE, BLOOD (ROUTINE X 2)  CULTURE, BLOOD (ROUTINE X 2)  LACTIC ACID, PLASMA  CBC WITH DIFFERENTIAL/PLATELET  PROCALCITONIN  FERRITIN  TRIGLYCERIDES  LACTIC ACID, PLASMA    EKG EKG Interpretation  Date/Time:  Wednesday April 12 2020 13:22:50 EDT Ventricular Rate:  95 PR Interval:  144 QRS Duration: 78 QT Interval:  330 QTC Calculation: 414 R Axis:   82 Text Interpretation: Normal sinus rhythm Cannot rule out Anterior infarct , age undetermined Abnormal ECG No significant change since last tracing Confirmed by Jacalyn Lefevre [phone]) on 04/12/2020 6:36:17 PM   Radiology DG Chest 2 View  Result Date: 04/12/2020 CLINICAL DATA:  The patient tested positive for COVID-19 04/05/2020. Dizziness and shortness of breath. Decreased oxygen saturation. EXAM: CHEST - 2 VIEW COMPARISON:  None. FINDINGS: Subtle airspace disease in the periphery of both lower lobes is more conspicuous on the left. Heart size is normal. No pneumothorax or pleural effusion. No acute or focal bony abnormality. IMPRESSION: Subtle bilateral lower lobe airspace disease is more notable on the left and worrisome for early pneumonia. Electronically Signed   By: Drusilla Kanner M.D.   On: 04/12/2020 13:55   CT Angio Chest PE W and/or Wo Contrast  Result Date: 04/12/2020 CLINICAL DATA:  54 year old female with concern for pulmonary embolism. Positive COVID-19 EXAM: CT ANGIOGRAPHY CHEST WITH CONTRAST TECHNIQUE: Multidetector CT imaging of the chest was performed using the standard protocol during bolus administration of intravenous contrast. Multiplanar CT image reconstructions and MIPs were obtained to evaluate the vascular anatomy. CONTRAST:  OMNIPAQUE IOHEXOL 350 MG/ML SOLN COMPARISON:  Chest radiograph dated 04/12/2020. FINDINGS: Cardiovascular: There is  no cardiomegaly or pericardial effusion. The thoracic aorta is unremarkable. There is no CT evidence of pulmonary embolism. Mediastinum/Nodes: Right hilar adenopathy measures 18 mm in short axis. Subcarinal lymph node measures 15 mm short axis. There is a small hiatal hernia. The esophagus and the thyroid gland are grossly unremarkable. No mediastinal fluid collection. Lungs/Pleura: Bilateral peripheral and subpleural clusters of ground-glass opacities consistent with multifocal pneumonia and in keeping with COVID-19. There is no pleural effusion pneumothorax. The central airways are patent. Upper Abdomen: Indeterminate bilateral adrenal hypodense lesions measuring 14 mm on the left. The visualized upper abdomen is otherwise unremarkable. Musculoskeletal: No chest wall abnormality. No acute or significant osseous findings. Review of the MIP images confirms the above findings. IMPRESSION: 1. No CT evidence of pulmonary embolism. 2. Multifocal pneumonia in keeping with COVID-19. Clinical correlation and follow-up to resolution recommended. 3. Right hilar and mediastinal adenopathy, likely reactive. Electronically Signed   By: Elgie Collard M.D.   On: 04/12/2020 20:23    Procedures Procedures (including critical care time)  Medications Ordered in ED Medications  0.9 %  sodium chloride infusion (1,000 mLs Intravenous New Bag/Given 04/12/20 1849)  azithromycin (ZITHROMAX) 500 mg in sodium chloride 0.9 % 250 mL IVPB (has no administration in time range)  albuterol (VENTOLIN HFA) 108 (90 Base) MCG/ACT inhaler 1-2 puff (has no administration in time range)  aerochamber Z-Stat Plus/medium 1 each (0 each Other Hold 04/12/20 1858)  dexamethasone (DECADRON) injection 10 mg (10 mg Intravenous Given 04/12/20 1850)  cefTRIAXone (ROCEPHIN) 1 g in sodium chloride 0.9 % 100 mL IVPB (0 g Intravenous Stopped 04/12/20 1908)  ketorolac (TORADOL) 30 MG/ML injection 30 mg (30 mg Intravenous Given 04/12/20 1850)  iohexol  (OMNIPAQUE) 350 MG/ML injection 100 mL (100 mLs Intravenous Contrast Given 04/12/20 2015)    ED Course  I have reviewed the triage vital signs and the nursing notes.  Pertinent labs & imaging results that were  available during my care of the patient were reviewed by me and considered in my medical decision making (see chart for details).    MDM Rules/Calculators/A&P                          Covid result from [facility] reviewed.  Covid +.  Due to the pneumonia, pt given rocephin/zithromax.  She is also given toradol and decadron for sx.  CTA shows no PE, just covid pneumonia.  Pt ambulated in room.  O2 only dropped to 91%.  Pt looks much better.  Pt is stable for d/c.  Return if worse.  F/u with pcp.  Azizah Lisle was evaluated in Emergency Department on 04/12/2020 for the symptoms described in the history of present illness. She was evaluated in the context of the global COVID-19 pandemic, which necessitated consideration that the patient might be at risk for infection with the SARS-CoV-2 virus that causes COVID-19. Institutional protocols and algorithms that pertain to the evaluation of patients at risk for COVID-19 are in a state of rapid change based on information released by regulatory bodies including the CDC and federal and state organizations. These policies and algorithms were followed during the patient's care in the ED.  Final Clinical Impression(s) / ED Diagnoses Final diagnoses:  Pneumonia due to COVID-19 virus    Rx / DC Orders ED Discharge Orders         Ordered    predniSONE (STERAPRED UNI-PAK 21 TAB) 10 MG (21) TBPK tablet        04/12/20 2049    doxycycline (VIBRAMYCIN) 100 MG capsule  2 times daily,   Status:  Discontinued        04/12/20 2049    doxycycline (VIBRAMYCIN) 100 MG capsule  2 times daily        04/12/20 2049           Jacalyn Lefevre, MD 04/12/20 2050

## 2020-04-12 NOTE — ED Triage Notes (Signed)
Pt tested covid+ 8/18 at [facility]. C/o dizziness and SOB O2 being 88% on RA at home. Has dry cough.

## 2020-04-13 ENCOUNTER — Telehealth: Payer: Self-pay | Admitting: *Deleted

## 2020-04-13 ENCOUNTER — Other Ambulatory Visit: Payer: Self-pay | Admitting: Family Medicine

## 2020-04-13 NOTE — Telephone Encounter (Signed)
Pharmacy called related to Rx: doxycycline quanity .Marland KitchenMarland KitchenEDCM clarified with EDP to change Rx to: #14.

## 2020-04-17 LAB — CULTURE, BLOOD (ROUTINE X 2)
Culture: NO GROWTH
Culture: NO GROWTH
Special Requests: ADEQUATE

## 2020-04-26 ENCOUNTER — Other Ambulatory Visit: Payer: Self-pay

## 2020-04-26 ENCOUNTER — Other Ambulatory Visit: Payer: Self-pay | Admitting: Family Medicine

## 2020-04-26 DIAGNOSIS — F902 Attention-deficit hyperactivity disorder, combined type: Secondary | ICD-10-CM

## 2020-04-26 MED ORDER — AMPHETAMINE-DEXTROAMPHET ER 20 MG PO CP24: 20.0000 mg | ORAL_CAPSULE | ORAL | 0 refills | Status: DC

## 2020-04-26 MED ORDER — AMPHETAMINE-DEXTROAMPHET ER 20 MG PO CP24
20.0000 mg | ORAL_CAPSULE | ORAL | 0 refills | Status: DC
Start: 2020-04-26 — End: 2020-07-26

## 2020-04-26 NOTE — Telephone Encounter (Signed)
RX was sent to [facility].   She needs this at [facility] in [place]. Meds pended. Patient left vm.

## 2020-04-26 NOTE — Addendum Note (Signed)
Addended bySilvio Pate on: 04/26/2020 10:25 AM   Modules accepted: Orders

## 2020-04-26 NOTE — Telephone Encounter (Signed)
Shanaia called and states the Adderall prescriptions for August and September was sent to [facility] by mistake. It is a [facility] and they don't dispense controlled medications. I did confirm with the pharmacy and cancelled the August and September prescriptions. Next follow up is in January.

## 2020-05-04 ENCOUNTER — Other Ambulatory Visit: Payer: Self-pay | Admitting: Obstetrics and Gynecology

## 2020-05-04 DIAGNOSIS — Z1231 Encounter for screening mammogram for malignant neoplasm of breast: Secondary | ICD-10-CM

## 2020-06-08 ENCOUNTER — Ambulatory Visit: Payer: Self-pay | Admitting: *Deleted

## 2020-06-08 ENCOUNTER — Ambulatory Visit
Admission: RE | Admit: 2020-06-08 | Discharge: 2020-06-08 | Disposition: A | Discharge status: Home / Self Care | Payer: No Typology Code available for payment source | Source: Ambulatory Visit | Attending: Obstetrics and Gynecology | Admitting: Obstetrics and Gynecology

## 2020-06-08 ENCOUNTER — Other Ambulatory Visit: Payer: Self-pay

## 2020-06-08 VITALS — BP 158/98 | Wt 194.7 lb

## 2020-06-08 DIAGNOSIS — Z1211 Encounter for screening for malignant neoplasm of colon: Secondary | ICD-10-CM

## 2020-06-08 DIAGNOSIS — Z01419 Encounter for gynecological examination (general) (routine) without abnormal findings: Secondary | ICD-10-CM

## 2020-06-08 DIAGNOSIS — Z1231 Encounter for screening mammogram for malignant neoplasm of breast: Secondary | ICD-10-CM

## 2020-06-08 NOTE — Progress Notes (Signed)
Kelli Tyler is a 54 y.o. G7P0 female who presents to [facility] clinic today with no complaints.    Pap Smear: Pap smear completed today. Last Pap smear was 04/10/2017 at [facility] clinic and was normal with negative HPV. Per patient has a history of an abnormal Pap smear in 2015 that a repeat Pap smear was completed for follow. Patient stated she has had two normal Pap smears since abnormal. Last Pap smear result is available in Epic.   Physical exam: Breasts Breasts symmetrical. No skin abnormalities bilateral breasts. No nipple retraction bilateral breasts. No nipple discharge bilateral breasts. No lymphadenopathy. No lumps palpated bilateral breasts. No complaints of pain or tenderness on exam.      Pelvic/Bimanual Ext Genitalia No lesions, no swelling and no discharge observed on external genitalia.        Vagina Vagina pink and normal texture. No lesions or discharge observed in vagina.        Cervix Cervix is present. Cervix pink and of normal texture. No discharge observed.    Uterus Uterus is present and palpable. Uterus in normal position and normal size.        Adnexae Bilateral ovaries present and palpable. No tenderness on palpation.         Rectovaginal No rectal exam completed today since patient had no rectal complaints. No skin abnormalities observed on exam.     Smoking History: Patient has is a former smoker that quit in December 2020.    Patient Navigation: Patient education provided. Access to services provided for patient through [facility] program.   Colorectal Cancer Screening: Per patient had a colonoscopy completed 3 years ago. No complaints today.    Breast and Cervical Cancer Risk Assessment: Patient has family history of her maternal grandmother and a maternal aunt having breast cancer. Patient has no known genetic mutations or history of radiation treatment to the chest before age 63. Patient does not have history of cervical  dysplasia, immunocompromised, or DES exposure in-utero.  Risk Assessment    Risk Scores      06/08/2020   Last edited by: Priscille Heidelberg, RN   5-year risk: 0.8 %   Lifetime risk: 7 %         A: [facility]CP exam with pap smear No complaints.  P: Referred patient to the [facility] for a screening mammogram on the mobile unit. Appointment scheduled Thursday, June 08, 2020 at 1110.  Priscille Heidelberg, RN 06/08/2020 10:24 AM

## 2020-06-08 NOTE — Patient Instructions (Signed)
Explained breast self awareness with Kelli Tyler. Pap smear completed today. Let her know that if today's Pap smear is normal that her next Pap smear is due in 3 years due to her history of an abnormal Pap smear. Referred patient to the [facility] for a screening mammogram on the mobile unit. Appointment scheduled Thursday, June 08, 2020 at 1110. Patient aware of appointment and will be there. Let patient know will follow up with her within the next couple weeks with results of her Pap smear by letter or phone. Informed patient that the Breast Center will follow-up with her within the next couple of weeks with results of her mammogram by letter or phone. Kelli Tyler verbalized understanding.  [name], [name], RN 10:24 AM

## 2020-06-09 ENCOUNTER — Other Ambulatory Visit: Payer: Self-pay | Admitting: Family Medicine

## 2020-06-09 DIAGNOSIS — F902 Attention-deficit hyperactivity disorder, combined type: Secondary | ICD-10-CM

## 2020-06-09 LAB — CYTOLOGY - PAP
Comment: NEGATIVE
Diagnosis: NEGATIVE
High risk HPV: NEGATIVE

## 2020-06-12 NOTE — Telephone Encounter (Signed)
Routing to covering provider.  °

## 2020-06-13 ENCOUNTER — Other Ambulatory Visit: Payer: Self-pay | Admitting: Family Medicine

## 2020-06-13 NOTE — Telephone Encounter (Signed)
Patient calling in wanting a refill on medications until she can come to be seen with PCP. States she is out of her traMADol (ULTRAM) 50 MG tablet [[id number]]  And her amphetamine-dextroamphetamine ([name]) 20 MG 24 hr capsule [[id number]] . Please advise.

## 2020-06-13 NOTE — Telephone Encounter (Signed)
Lilliahna has a prescription of the Adderall on hold at the pharmacy. Sent Tramadol refill to PCP.

## 2020-06-14 MED ORDER — TRAMADOL HCL 50 MG PO TABS
50.0000 mg | ORAL_TABLET | Freq: Three times a day (TID) | ORAL | 0 refills | Status: DC | PRN
Start: 2020-06-14 — End: 2020-09-08

## 2020-06-20 ENCOUNTER — Encounter: Payer: Self-pay | Admitting: Family Medicine

## 2020-06-20 ENCOUNTER — Ambulatory Visit (INDEPENDENT_AMBULATORY_CARE_PROVIDER_SITE_OTHER): Payer: Self-pay | Admitting: Family Medicine

## 2020-06-20 VITALS — BP 131/75 | HR 84 | Ht 64.0 in | Wt 197.0 lb

## 2020-06-20 DIAGNOSIS — M5441 Lumbago with sciatica, right side: Secondary | ICD-10-CM

## 2020-06-20 DIAGNOSIS — R232 Flushing: Secondary | ICD-10-CM

## 2020-06-20 DIAGNOSIS — F321 Major depressive disorder, single episode, moderate: Secondary | ICD-10-CM

## 2020-06-20 DIAGNOSIS — G8929 Other chronic pain: Secondary | ICD-10-CM

## 2020-06-20 MED ORDER — VENLAFAXINE HCL ER 150 MG PO CP24: ORAL_CAPSULE | ORAL | 1 refills | Status: DC

## 2020-06-20 NOTE — Assessment & Plan Note (Addendum)
Unfortunately, the Cymbalta and gabapentin really were not helpful.  Discussed option she would like to consider an injection she just does not have insurance coverage right now I think it can be cost prohibitive to be honest.  And she cannot afford formal physical therapy right now.  Continue with tramadol as needed but again I would like to circle back around to this if she is able to at some point.

## 2020-06-20 NOTE — Progress Notes (Signed)
Established Patient Office Visit  Subjective:  Patient ID: Kelli Tyler, female    DOB: Jun 13, 1966  Age: 54 y.o. MRN: [id number]  CC:  Chief Complaint  Patient presents with  . Follow-up    HPI Kelli Tyler presents for follow-up of medication.  When I last saw her in July we discussed her chronic bilateral low back pain and had discussed a trial of Cymbalta to see if she felt like it was helpful with her pain as well as the gabapentin she felt like neither medication was really that helpful.  Is actually back on her Effexor.  He really prefers the Effexor she feels like it works well.  She did report that the gabapentin was really helpful for hot flashes but came off of it as she is hopeful she will do better over the winter months.  I last saw her she actually developed Covid pneumonia at the end of August.  She was actually hospitalized but is doing better.  She overall is doing better she feels like she is almost back to baseline.  She occasionally feels like she is a little forgetful but really feels like that is the only residual symptom.  Her brother actually got Covid around the same time she did and he passed away unfortunately.  Past Medical History:  Diagnosis Date  . ADHD   . Back pain   . Restless leg     Past Surgical History:  Procedure Laterality Date  . ABLATION    . CHOLECYSTECTOMY    . NASAL FRACTURE SURGERY    . TUBAL LIGATION      Family History  Problem Relation Age of Onset  . Aneurysm Mother 76  . Stroke Mother 12  . Colon cancer Father 6  . Breast cancer Maternal Aunt   . Breast cancer Maternal Grandmother   . Heart attack Brother     Social History   Socioeconomic History  . Marital status: Married    Spouse name: Not on file  . Number of children: Not on file  . Years of education: Not on file  . Highest education level: Not on file  Occupational History  . Not on file  Tobacco Use  . Smoking status: Former Smoker    Packs/day: 1.00     Quit date: 07/29/2019    Years since quitting: 0.8  . Smokeless tobacco: Former Clinical biochemist  . Vaping Use: Never used  Substance and Sexual Activity  . Alcohol use: Yes  . Drug use: No  . Sexual activity: Yes    Birth control/protection: Surgical  Other Topics Concern  . Not on file  Social History Narrative  . Not on file   Social Determinants of Health   Financial Resource Strain:   . Difficulty of Paying Living Expenses: Not on file  Food Insecurity:   . Worried About Programme researcher, broadcasting/film/video in the Last Year: Not on file  . Ran Out of Food in the Last Year: Not on file  Transportation Needs: No Transportation Needs  . Lack of Transportation (Medical): No  . Lack of Transportation (Non-Medical): No  Physical Activity:   . Days of Exercise per Week: Not on file  . Minutes of Exercise per Session: Not on file  Stress:   . Feeling of Stress : Not on file  Social Connections:   . Frequency of Communication with Friends and Family: Not on file  . Frequency of Social Gatherings with Friends and Family: Not  on file  . Attends Religious Services: Not on file  . Active Member of Clubs or Organizations: Not on file  . Attends Banker Meetings: Not on file  . Marital Status: Not on file  Intimate Partner Violence:   . Fear of Current or Ex-Partner: Not on file  . Emotionally Abused: Not on file  . Physically Abused: Not on file  . Sexually Abused: Not on file    Outpatient Medications Prior to Visit  Medication Sig Dispense Refill  . amphetamine-dextroamphetamine ([name]) 20 MG 24 hr capsule Take 1 capsule (20 mg total) by mouth every morning. 30 capsule 0  . amphetamine-dextroamphetamine ([name]) 20 MG 24 hr capsule Take 1 capsule (20 mg total) by mouth every morning. 30 capsule 0  . aspirin EC 81 MG tablet Take 81 mg by mouth daily.    . cyclobenzaprine (FLEXERIL) 10 MG tablet Take 1 tablet (10 mg total) by mouth at bedtime as needed for muscle  spasms. 90 tablet 0  . traMADol (ULTRAM) 50 MG tablet Take 1 tablet (50 mg total) by mouth 3 (three) times daily as needed for severe pain. 90 tablet 0  . venlafaxine XR (EFFEXOR-XR) 150 MG 24 hr capsule TAKE 1 CAPSULE BY MOUTH EVERY DAY 90 capsule 1  . doxycycline (VIBRAMYCIN) 100 MG capsule Take 1 capsule (100 mg total) by mouth 2 (two) times daily. (Patient not taking: Reported on 06/08/2020) 14 capsule 0  . DULoxetine (CYMBALTA) 30 MG capsule Take 1 capsule (30 mg total) by mouth daily. (Patient not taking: Reported on 06/08/2020) 30 capsule 3  . gabapentin (NEURONTIN) 100 MG capsule Take 1-2 capsules (100-200 mg total) by mouth at bedtime. (Patient not taking: Reported on 06/08/2020) 60 capsule 3  . predniSONE (STERAPRED UNI-PAK 21 TAB) 10 MG (21) TBPK tablet Take 6 tabs for 2 days, then 5 for 2 days, then 4 for 2 days, then 3 for 2 days, 2 for 2 days, then 1 for 2 days (Patient not taking: Reported on 06/08/2020) 42 tablet 0   No facility-administered medications prior to visit.    Allergies  Allergen Reactions  . Wellbutrin [Bupropion] Other (See Comments)    Back Pain    ROS Review of Systems    Objective:    Physical Exam Constitutional:      Appearance: She is well-developed.  HENT:     Head: Normocephalic and atraumatic.  Cardiovascular:     Rate and Rhythm: Normal rate and regular rhythm.     Heart sounds: Normal heart sounds.  Pulmonary:     Effort: Pulmonary effort is normal.     Breath sounds: Normal breath sounds.  Skin:    General: Skin is warm and dry.  Neurological:     Mental Status: She is alert and oriented to person, place, and time.  Psychiatric:        Behavior: Behavior normal.     BP 131/75   Pulse 84   Ht 5\' 4"  (1.626 m)   Wt 197 lb (89.4 kg)   LMP 08/07/2015   SpO2 99%   BMI 33.81 kg/m  Wt Readings from Last 3 Encounters:  06/20/20 197 lb (89.4 kg)  06/08/20 194 lb 11.2 oz (88.3 kg)  04/12/20 195 lb (88.5 kg)     There are no  preventive care reminders to display for this patient.  There are no preventive care reminders to display for this patient.  Lab Results  Component Value Date   TSH  0.68 11/06/2017   Lab Results  Component Value Date   WBC 7.0 04/12/2020   HGB 13.4 04/12/2020   HCT 41.7 04/12/2020   MCV 88.7 04/12/2020   PLT 249 04/12/2020   Lab Results  Component Value Date   NA 136 04/12/2020   K 3.7 04/12/2020   CO2 26 04/12/2020   GLUCOSE 108 (H) 04/12/2020   BUN 18 04/12/2020   CREATININE 0.96 04/12/2020   BILITOT 0.3 04/12/2020   ALKPHOS 92 04/12/2020   AST 33 04/12/2020   ALT 28 04/12/2020   PROT 7.3 04/12/2020   ALBUMIN 3.8 04/12/2020   CALCIUM 8.7 (L) 04/12/2020   ANIONGAP 13 04/12/2020   Lab Results  Component Value Date   CHOL 196 10/11/2019   Lab Results  Component Value Date   HDL 68 10/11/2019   Lab Results  Component Value Date   LDLCALC 112 10/11/2019   Lab Results  Component Value Date   TRIG 103 04/12/2020   Lab Results  Component Value Date   CHOLHDL 3.3 11/06/2017   Lab Results  Component Value Date   HGBA1C 5.7 (A) 03/03/2020      Assessment & Plan:   Problem List Items Addressed This Visit      Cardiovascular and Mediastinum   Hot flashes    She did get some relief with the gabapentin in fact she says it was really quite helpful.  But she feels like she is going to do well through the winter and so would rather consider going back on it in the summertime if needed she is also having hot flashes are just gradually getting much better on their own.        Nervous and Auditory   Chronic bilateral low back pain with right-sided sciatica - Primary    Unfortunately, the Cymbalta and gabapentin really were not helpful.  Discussed option she would like to consider an injection she just does not have insurance coverage right now I think it can be cost prohibitive to be honest.  And she cannot afford formal physical therapy right now.  Continue with  tramadol as needed but again I would like to circle back around to this if she is able to at some point.      Relevant Medications   venlafaxine XR (EFFEXOR-XR) 150 MG 24 hr capsule     Other   Depression, major, single episode, moderate (HCC)    She really does well on the Effexor is happy with that regimen she feels like it helps with her mood as well as her hot flashes.  Continue current regimen refill sent for the next 6 months.      Relevant Medications   venlafaxine XR (EFFEXOR-XR) 150 MG 24 hr capsule      Meds ordered this encounter  Medications  . venlafaxine XR (EFFEXOR-XR) 150 MG 24 hr capsule    Sig: TAKE 1 CAPSULE BY MOUTH EVERY DAY    Dispense:  90 capsule    Refill:  1    Follow-up: Return in about 6 months (around 12/18/2020) for ADD meds, Back Pain, etc .    Nani Gasser, MD

## 2020-06-20 NOTE — Assessment & Plan Note (Signed)
She really does well on the Effexor is happy with that regimen she feels like it helps with her mood as well as her hot flashes.  Continue current regimen refill sent for the next 6 months.

## 2020-06-20 NOTE — Assessment & Plan Note (Signed)
She did get some relief with the gabapentin in fact she says it was really quite helpful.  But she feels like she is going to do well through the winter and so would rather consider going back on it in the summertime if needed she is also having hot flashes are just gradually getting much better on their own.

## 2020-07-26 ENCOUNTER — Other Ambulatory Visit: Payer: Self-pay

## 2020-07-26 DIAGNOSIS — F902 Attention-deficit hyperactivity disorder, combined type: Secondary | ICD-10-CM

## 2020-07-26 NOTE — Telephone Encounter (Signed)
[facility] is requesting refills on Adderall. She states she talked about this at her November visit.

## 2020-07-28 MED ORDER — AMPHETAMINE-DEXTROAMPHET ER 20 MG PO CP24: 20.0000 mg | ORAL_CAPSULE | ORAL | 0 refills | Status: DC

## 2020-07-28 MED ORDER — AMPHETAMINE-DEXTROAMPHET ER 20 MG PO CP24
20.0000 mg | ORAL_CAPSULE | ORAL | 0 refills | Status: DC
Start: 2020-07-28 — End: 2020-12-21

## 2020-09-04 ENCOUNTER — Ambulatory Visit: Payer: Self-pay | Admitting: Family Medicine

## 2020-09-08 ENCOUNTER — Other Ambulatory Visit: Payer: Self-pay | Admitting: Family Medicine

## 2020-12-21 ENCOUNTER — Ambulatory Visit (INDEPENDENT_AMBULATORY_CARE_PROVIDER_SITE_OTHER): Payer: No Typology Code available for payment source | Admitting: Family Medicine

## 2020-12-21 ENCOUNTER — Other Ambulatory Visit: Payer: Self-pay

## 2020-12-21 ENCOUNTER — Encounter: Payer: Self-pay | Admitting: Family Medicine

## 2020-12-21 VITALS — BP 132/78 | HR 69 | Ht 64.0 in | Wt 204.0 lb

## 2020-12-21 DIAGNOSIS — Z7689 Persons encountering health services in other specified circumstances: Secondary | ICD-10-CM

## 2020-12-21 DIAGNOSIS — F902 Attention-deficit hyperactivity disorder, combined type: Secondary | ICD-10-CM

## 2020-12-21 DIAGNOSIS — G8929 Other chronic pain: Secondary | ICD-10-CM

## 2020-12-21 DIAGNOSIS — R1032 Left lower quadrant pain: Secondary | ICD-10-CM

## 2020-12-21 DIAGNOSIS — R635 Abnormal weight gain: Secondary | ICD-10-CM

## 2020-12-21 DIAGNOSIS — M5441 Lumbago with sciatica, right side: Secondary | ICD-10-CM

## 2020-12-21 DIAGNOSIS — R7301 Impaired fasting glucose: Secondary | ICD-10-CM

## 2020-12-21 DIAGNOSIS — E7800 Pure hypercholesterolemia, unspecified: Secondary | ICD-10-CM

## 2020-12-21 DIAGNOSIS — Z6835 Body mass index (BMI) 35.0-35.9, adult: Secondary | ICD-10-CM

## 2020-12-21 MED ORDER — PHENTERMINE HCL 15 MG PO CAPS: 15.0000 mg | ORAL_CAPSULE | ORAL | 0 refills | Status: DC

## 2020-12-21 NOTE — Progress Notes (Signed)
Pt reports that she has stopped all of her medications. She did this 7 weeks ago.   She has noticed that

## 2020-12-21 NOTE — Progress Notes (Signed)
Established Patient Office Visit  Subjective:  Patient ID: Kelli Tyler, female    DOB: 1965/08/21  Age: 55 y.o. MRN: [id number]  CC:  Chief Complaint  Patient presents with  . Back Pain    HPI Kelli Tyler presents for follow-up.  She says that she was just feeling so poorly about 7 weeks ago that she did not even want to live so she decided to just stop all of her medications so her tramadol, Adderall, and Effexor.  She said she actually started feeling much better she realized that when she took the tramadol she would get pain relief but then when it would wear off she would almost have a rebound phenomenon where she would actually feel worse.  So for now she is mostly just been relying on ibuprofen and Tylenol for her chronic low back pain.  She says it helps some.  She has tried over their agents in the past such as gabapentin and I believe even Cymbalta.  Groin Pain, right-she says its been going on for at least a few days she has been getting numbness and tingling.  It is right over the mid groin crease area.  She denies any pain in the hip itself popping clicking or giving out.  She denies any blood in the urine or stool.  She denies any known injury or trauma.  She says she mostly wears leggings and they are not very tight fitting.  She says sometimes it just almost feels like a pressure in her lower buttock area as well.  She sometimes will even take the tramadol for it and does get some relief.  She says that she feels the most like it is a soreness.  She would also really like to have her B12 checked.  She said years ago she actually got B12 injections for deficiency and would like to make sure that it is not low again.  She would also really like to discuss weight loss and possibly getting back on phentermine she did well with it for short period of time but then had a lot of things going on and was not really working on it adamantly and quit losing weight.  She would like to retry  it and now that she is off of her Adderall she feels safe to do so.  Past Medical History:  Diagnosis Date  . ADHD   . Back pain   . Restless leg     Past Surgical History:  Procedure Laterality Date  . ABLATION    . CHOLECYSTECTOMY    . NASAL FRACTURE SURGERY    . TUBAL LIGATION      Family History  Problem Relation Age of Onset  . Aneurysm Mother 76  . Stroke Mother 20  . Colon cancer Father 13  . Breast cancer Maternal Aunt   . Breast cancer Maternal Grandmother   . Heart attack Brother     Social History   Socioeconomic History  . Marital status: Married    Spouse name: Not on file  . Number of children: Not on file  . Years of education: Not on file  . Highest education level: Not on file  Occupational History  . Not on file  Tobacco Use  . Smoking status: Former Smoker    Packs/day: 1.00    Quit date: 07/29/2019    Years since quitting: 1.4  . Smokeless tobacco: Former Clinical biochemist  . Vaping Use: Never used  Substance and Sexual  Activity  . Alcohol use: Yes  . Drug use: No  . Sexual activity: Yes    Birth control/protection: Surgical  Other Topics Concern  . Not on file  Social History Narrative  . Not on file   Social Determinants of Health   Financial Resource Strain: Not on file  Food Insecurity: Not on file  Transportation Needs: No Transportation Needs  . Lack of Transportation (Medical): No  . Lack of Transportation (Non-Medical): No  Physical Activity: Not on file  Stress: Not on file  Social Connections: Not on file  Intimate Partner Violence: Not on file    Outpatient Medications Prior to Visit  Medication Sig Dispense Refill  . amphetamine-dextroamphetamine ([name]) 20 MG 24 hr capsule Take 1 capsule (20 mg total) by mouth every morning. 30 capsule 0  . amphetamine-dextroamphetamine ([name]) 20 MG 24 hr capsule Take 1 capsule (20 mg total) by mouth every morning. (Patient not taking: Reported on 12/21/2020) 30 capsule  0  . aspirin EC 81 MG tablet Take 81 mg by mouth daily. (Patient not taking: Reported on 12/21/2020)    . cyclobenzaprine (FLEXERIL) 10 MG tablet Take 1 tablet (10 mg total) by mouth at bedtime as needed for muscle spasms. 90 tablet 0  . traMADol (ULTRAM) 50 MG tablet TAKE ONE TABLET BY MOUTH THREE TIMES A DAY AS NEEDED FOR SEVERE PAIN 90 tablet 0  . venlafaxine XR (EFFEXOR-XR) 150 MG 24 hr capsule TAKE 1 CAPSULE BY MOUTH EVERY DAY 90 capsule 1   No facility-administered medications prior to visit.    Allergies  Allergen Reactions  . Wellbutrin [Bupropion] Other (See Comments)    Back Pain    ROS Review of Systems    Objective:    Physical Exam Constitutional:      Appearance: She is well-developed.  HENT:     Head: Normocephalic and atraumatic.  Cardiovascular:     Rate and Rhythm: Normal rate and regular rhythm.     Heart sounds: Normal heart sounds.  Pulmonary:     Effort: Pulmonary effort is normal.     Breath sounds: Normal breath sounds.  Musculoskeletal:     Comments: Right hip is nontender.  No inflammation erythema rash over the groin crease.  Normal range of motion of the hip, without pain.  Strength is 5 out of 5 at the hip and knee.  Skin:    General: Skin is warm and dry.  Neurological:     Mental Status: She is alert and oriented to person, place, and time.  Psychiatric:        Behavior: Behavior normal.     BP 132/78   Pulse 69   Ht 5\' 4"  (1.626 m)   Wt 204 lb (92.5 kg)   LMP 08/07/2015   SpO2 99%   BMI 35.02 kg/m  Wt Readings from Last 3 Encounters:  12/21/20 204 lb (92.5 kg)  06/20/20 197 lb (89.4 kg)  06/08/20 194 lb 11.2 oz (88.3 kg)     There are no preventive care reminders to display for this patient.  There are no preventive care reminders to display for this patient.  Lab Results  Component Value Date   TSH 0.68 11/06/2017   Lab Results  Component Value Date   WBC 7.0 04/12/2020   HGB 13.4 04/12/2020   HCT 41.7 04/12/2020    MCV 88.7 04/12/2020   PLT 249 04/12/2020   Lab Results  Component Value Date   NA 136 04/12/2020  K 3.7 04/12/2020   CO2 26 04/12/2020   GLUCOSE 108 (H) 04/12/2020   BUN 18 04/12/2020   CREATININE 0.96 04/12/2020   BILITOT 0.3 04/12/2020   ALKPHOS 92 04/12/2020   AST 33 04/12/2020   ALT 28 04/12/2020   PROT 7.3 04/12/2020   ALBUMIN 3.8 04/12/2020   CALCIUM 8.7 (L) 04/12/2020   ANIONGAP 13 04/12/2020   Lab Results  Component Value Date   CHOL 196 10/11/2019   Lab Results  Component Value Date   HDL 68 10/11/2019   Lab Results  Component Value Date   LDLCALC 112 10/11/2019   Lab Results  Component Value Date   TRIG 103 04/12/2020   Lab Results  Component Value Date   CHOLHDL 3.3 11/06/2017   Lab Results  Component Value Date   HGBA1C 5.7 (A) 03/03/2020      Assessment & Plan:   Problem List Items Addressed This Visit      Endocrine   IFG (impaired fasting glucose)    We will check A1c.  Last one was 5.7 in July so its been over 6 months.  Lab Results  Component Value Date   HGBA1C 5.7 (A) 03/03/2020         Relevant Orders   Lipid Panel w/reflex Direct LDL   HgB A1c   COMPLETE METABOLIC PANEL WITH GFR   TSH   B12     Nervous and Auditory   Chronic bilateral low back pain with right-sided sciatica    She actually feels much better off of the tramadol edema sounds like she was getting a rebound phenomenon when it would wear off so we discussed right now since overall she feels like she is in a better place would not hold off on any additional pain medication she had initially asked for hydrocodone.      Relevant Medications   phentermine 15 MG capsule     Other   Encounter for weight management    Starting weight: 204 lbs   Current weight:204 Previous weight:197  Change in weight: up 7 lb Goal weight: Dietary goals: cut back on carbs  Exercise goals: Medication: PHentermine 15mg  cap  Follow-up and referrals: 4-6 weeks.           Relevant Medications   phentermine 15 MG capsule   Elevated LDL cholesterol level - Primary    Plan to recheck lipids to make sure that she is at goal.      Relevant Orders   Lipid Panel w/reflex Direct LDL   HgB A1c   COMPLETE METABOLIC PANEL WITH GFR   TSH   B12   Attention deficit hyperactivity disorder (ADHD)    He is opted to come off of medications and is doing well without this.       Other Visit Diagnoses    Abnormal weight gain       Relevant Medications   phentermine 15 MG capsule   Other Relevant Orders   Lipid Panel w/reflex Direct LDL   HgB A1c   COMPLETE METABOLIC PANEL WITH GFR   TSH   B12   Left groin pain       BMI 35.0-35.9,adult          Left groin pain-most consistent with meralgia paresthetica.  I think with improved activity and weight loss and avoiding compression on the area it should gradually improve on its own.  I do not see any worrisome findings in regards to the musculoskeletal system.  She really  does not want to take anything like gabapentin so we will avoid those drugs.  And just continue to monitor carefully  Meds ordered this encounter  Medications  . phentermine 15 MG capsule    Sig: Take 1 capsule (15 mg total) by mouth every morning.    Dispense:  30 capsule    Refill:  0    Follow-up: Return in about 4 weeks (around 01/18/2021) for nurse visit for BP and weight check .    Nani Gasser, MD

## 2020-12-22 DIAGNOSIS — Z7689 Persons encountering health services in other specified circumstances: Secondary | ICD-10-CM | POA: Insufficient documentation

## 2020-12-22 NOTE — Assessment & Plan Note (Signed)
Plan to recheck lipids to make sure that she is at goal.

## 2020-12-22 NOTE — Assessment & Plan Note (Signed)
He is opted to come off of medications and is doing well without this.

## 2020-12-22 NOTE — Assessment & Plan Note (Addendum)
We will check A1c.  Last one was 5.7 in July so its been over 6 months.  Lab Results  Component Value Date   HGBA1C 5.7 (A) 03/03/2020

## 2020-12-22 NOTE — Assessment & Plan Note (Signed)
Starting weight: 204 lbs   Current weight:204 Previous weight:197  Change in weight: up 7 lb Goal weight: Dietary goals: cut back on carbs  Exercise goals: Medication: PHentermine 15mg  cap  Follow-up and referrals: 4-6 weeks.

## 2020-12-22 NOTE — Assessment & Plan Note (Signed)
She actually feels much better off of the tramadol edema sounds like she was getting a rebound phenomenon when it would wear off so we discussed right now since overall she feels like she is in a better place would not hold off on any additional pain medication she had initially asked for hydrocodone.

## 2021-01-18 ENCOUNTER — Ambulatory Visit (INDEPENDENT_AMBULATORY_CARE_PROVIDER_SITE_OTHER): Payer: Self-pay | Admitting: Family Medicine

## 2021-01-18 ENCOUNTER — Other Ambulatory Visit: Payer: Self-pay

## 2021-01-18 DIAGNOSIS — R635 Abnormal weight gain: Secondary | ICD-10-CM

## 2021-01-18 DIAGNOSIS — Z7689 Persons encountering health services in other specified circumstances: Secondary | ICD-10-CM

## 2021-01-18 MED ORDER — PHENTERMINE HCL 15 MG PO CAPS: 15.0000 mg | ORAL_CAPSULE | ORAL | 0 refills | Status: DC

## 2021-01-18 NOTE — Progress Notes (Signed)
Wt Readings from Last 3 Encounters:  01/18/21 199 lb (90.3 kg)  12/21/20 204 lb (92.5 kg)  06/20/20 197 lb (89.4 kg)   Lost 5 lbs which is fantastic.  RF phentermine.  BP elevated.  If still high at next OV will need to d/c medication.  F/U in 4 weeks.    Nani Gasser, MD   Meds ordered this encounter  Medications  . phentermine 15 MG capsule    Sig: Take 1 capsule (15 mg total) by mouth every morning.    Dispense:  30 capsule    Refill:  0

## 2021-01-18 NOTE — Progress Notes (Signed)
Established Patient Office Visit  Subjective:  Patient ID: Kelli Tyler, female    DOB: 15-Feb-1966  Age: 55 y.o. MRN: [id number]  CC:  Chief Complaint  Patient presents with  . Weight Check    HPI Kelli Tyler presents for a weight and BP check. Pt has lost weight since last visit. Current weight today is 199lbs. Phentermine refill pended though pt requests higher dose.  Pt also states she will be getting her blood work done [facility] health dept due to cost difference. First BP is 141/88, second BP 144/85.  Past Medical History:  Diagnosis Date  . ADHD   . Back pain   . Restless leg     Past Surgical History:  Procedure Laterality Date  . ABLATION    . CHOLECYSTECTOMY    . NASAL FRACTURE SURGERY    . TUBAL LIGATION      Family History  Problem Relation Age of Onset  . Aneurysm Mother 52  . Stroke Mother 69  . Colon cancer Father 68  . Breast cancer Maternal Aunt   . Breast cancer Maternal Grandmother   . Heart attack Brother     Social History   Socioeconomic History  . Marital status: Married    Spouse name: Not on file  . Number of children: Not on file  . Years of education: Not on file  . Highest education level: Not on file  Occupational History  . Not on file  Tobacco Use  . Smoking status: Former Smoker    Packs/day: 1.00    Quit date: 07/29/2019    Years since quitting: 1.4  . Smokeless tobacco: Former Clinical biochemist  . Vaping Use: Never used  Substance and Sexual Activity  . Alcohol use: Yes  . Drug use: No  . Sexual activity: Yes    Birth control/protection: Surgical  Other Topics Concern  . Not on file  Social History Narrative  . Not on file   Social Determinants of Health   Financial Resource Strain: Not on file  Food Insecurity: Not on file  Transportation Needs: No Transportation Needs  . Lack of Transportation (Medical): No  . Lack of Transportation (Non-Medical): No  Physical Activity: Not on file  Stress: Not on file   Social Connections: Not on file  Intimate Partner Violence: Not on file    Outpatient Medications Prior to Visit  Medication Sig Dispense Refill  . phentermine 15 MG capsule Take 1 capsule (15 mg total) by mouth every morning. 30 capsule 0   No facility-administered medications prior to visit.    Allergies  Allergen Reactions  . Wellbutrin [Bupropion] Other (See Comments)    Back Pain    ROS Review of Systems    Objective:    Physical Exam  BP (!) 141/88 (BP Location: Left Arm, Patient Position: Sitting, Cuff Size: Normal)   Pulse 83   Temp 98.1 F (36.7 C) (Oral)   Resp 20   Ht 5\' 4"  (1.626 m)   Wt 199 lb (90.3 kg)   LMP 08/07/2015   SpO2 99%   BMI 34.16 kg/m  Wt Readings from Last 3 Encounters:  01/18/21 199 lb (90.3 kg)  12/21/20 204 lb (92.5 kg)  06/20/20 197 lb (89.4 kg)     Health Maintenance Due  Topic Date Due  . COVID-19 Vaccine (1) Never done  . Zoster Vaccines- Shingrix (1 of 2) Never done  . COLONOSCOPY (Pts 45-92yrs Insurance coverage will need to be confirmed)  09/27/2018    There are no preventive care reminders to display for this patient.  Lab Results  Component Value Date   TSH 0.68 11/06/2017   Lab Results  Component Value Date   WBC 7.0 04/12/2020   HGB 13.4 04/12/2020   HCT 41.7 04/12/2020   MCV 88.7 04/12/2020   PLT 249 04/12/2020   Lab Results  Component Value Date   NA 136 04/12/2020   K 3.7 04/12/2020   CO2 26 04/12/2020   GLUCOSE 108 (H) 04/12/2020   BUN 18 04/12/2020   CREATININE 0.96 04/12/2020   BILITOT 0.3 04/12/2020   ALKPHOS 92 04/12/2020   AST 33 04/12/2020   ALT 28 04/12/2020   PROT 7.3 04/12/2020   ALBUMIN 3.8 04/12/2020   CALCIUM 8.7 (L) 04/12/2020   ANIONGAP 13 04/12/2020   Lab Results  Component Value Date   CHOL 196 10/11/2019   Lab Results  Component Value Date   HDL 68 10/11/2019   Lab Results  Component Value Date   LDLCALC 112 10/11/2019   Lab Results  Component Value Date    TRIG 103 04/12/2020   Lab Results  Component Value Date   CHOLHDL 3.3 11/06/2017   Lab Results  Component Value Date   HGBA1C 5.7 (A) 03/03/2020      Assessment & Plan:  Follow up in 4 weeks and weight and BP check. Problem List Items Addressed This Visit   None     No orders of the defined types were placed in this encounter.   Follow-up: Return in about 4 weeks (around 02/15/2021) for Weight check.    Kathrynn Speed, CMA

## 2021-01-18 NOTE — Progress Notes (Signed)
Pt has lost weight since last visit. Current weight today is 199lbs. Phentermine refill pended though pt requests higher dose. Pt also states she will be getting her blood work done at the health dept due to cost difference. First BP is 141/88, second BP 144/85.

## 2021-02-11 ENCOUNTER — Telehealth: Payer: Self-pay | Admitting: Family Medicine

## 2021-02-11 NOTE — Telephone Encounter (Signed)
Call pt: your blood count is great! No anemia. Your vitamin D is low to make sure taking 25 mcg daily, especially in the fall and winter months.  Though I would go ahead and start it now.  Your kidney and liver is normal.  Your A1C is in the prediabetes range.  Work on cutting back on sugars and carbs.  Plan to recheck that in 6 months.  Your cholesterol is just above optimal. Continue to work on healthy diet and exercise. Your alk phos also elevated. I would like to keep an eye on this and plan to recheck in 3 months. Your thyroid and b12 are normal.

## 2021-02-13 NOTE — Telephone Encounter (Signed)
LVM with results and recommedations

## 2021-02-15 ENCOUNTER — Other Ambulatory Visit: Payer: Self-pay

## 2021-02-15 ENCOUNTER — Ambulatory Visit (INDEPENDENT_AMBULATORY_CARE_PROVIDER_SITE_OTHER): Payer: Self-pay | Admitting: Family Medicine

## 2021-02-15 VITALS — BP 141/80 | HR 78 | Ht 64.0 in | Wt 195.0 lb

## 2021-02-15 DIAGNOSIS — Z7689 Persons encountering health services in other specified circumstances: Secondary | ICD-10-CM

## 2021-02-15 DIAGNOSIS — R635 Abnormal weight gain: Secondary | ICD-10-CM

## 2021-02-15 MED ORDER — PHENTERMINE HCL 15 MG PO CAPS: 15.0000 mg | ORAL_CAPSULE | ORAL | 0 refills | Status: DC

## 2021-02-15 NOTE — Assessment & Plan Note (Signed)
Starting weight: 204 lbs   Current weight:195 Previous weight:199    Change in weight: down 4 lb Goal weight: Dietary goals: cut back on carbs Exercise goals: stay active.  Medication: PHentermine 15mg  cap  Follow-up and referrals: 4-6 weeks.

## 2021-02-15 NOTE — Progress Notes (Signed)
Have her schedule f/u with me in 1 mo. We can keep an eye on BP.  If still up at next OV may have to look at options.   Encounter for weight management Starting weight: 204 lbs    Current weight:195 Previous weight:199    Change in weight: down 4 lb Goal weight: Dietary goals: cut back on carbs Exercise goals: stay active.  Medication: PHentermine 15mg  cap  Follow-up and referrals: 4-6 weeks.    Meds ordered this encounter  Medications   phentermine 15 MG capsule    Sig: Take 1 capsule (15 mg total) by mouth every morning.    Dispense:  30 capsule    Refill:  0

## 2021-02-15 NOTE — Progress Notes (Signed)
Established Patient Office Visit  Subjective:  Patient ID: Kelli Tyler, female    DOB: Feb 14, 1966  Age: 55 y.o. MRN: [id number]  CC:  Chief Complaint  Patient presents with   Weight Check    HPI [name] is here for blood pressure and weight check. Diet and exercise is going well. Denies trouble sleeping or palpitations.    Elevation of blood pressure - [place] states her home numbers have been around 127/85.   Mood/hot flashes - She states she restarted Effexor 150 mg every other day. She reports it makes her feel sleepy even taking it at night.   Past Medical History:  Diagnosis Date   ADHD    Back pain    Restless leg     Past Surgical History:  Procedure Laterality Date   ABLATION     CHOLECYSTECTOMY     NASAL FRACTURE SURGERY     TUBAL LIGATION      Family History  Problem Relation Age of Onset   Aneurysm Mother 22   Stroke Mother 55   Colon cancer Father 4   Breast cancer Maternal Aunt    Breast cancer Maternal Grandmother    Heart attack Brother     Social History   Socioeconomic History   Marital status: Married    Spouse name: Not on file   Number of children: Not on file   Years of education: Not on file   Highest education level: Not on file  Occupational History   Not on file  Tobacco Use   Smoking status: Former    Packs/day: 1.00    Pack years: 0.00    Types: Cigarettes    Quit date: 07/29/2019    Years since quitting: 1.5   Smokeless tobacco: Former  Building services engineer Use: Never used  Substance and Sexual Activity   Alcohol use: Yes   Drug use: No   Sexual activity: Yes    Birth control/protection: Surgical  Other Topics Concern   Not on file  Social History Narrative   Not on file   Social Determinants of Health   Financial Resource Strain: Not on file  Food Insecurity: Not on file  Transportation Needs: No Transportation Needs   Lack of Transportation (Medical): No   Lack of Transportation (Non-Medical): No   Physical Activity: Not on file  Stress: Not on file  Social Connections: Not on file  Intimate Partner Violence: Not on file    Outpatient Medications Prior to Visit  Medication Sig Dispense Refill   phentermine 15 MG capsule Take 1 capsule (15 mg total) by mouth every morning. 30 capsule 0   venlafaxine XR (EFFEXOR-XR) 150 MG 24 hr capsule Take 150 mg by mouth every other day.     No facility-administered medications prior to visit.    Allergies  Allergen Reactions   Wellbutrin [Bupropion] Other (See Comments)    Back Pain    ROS Review of Systems    Objective:    Physical Exam  BP (!) 142/84   Pulse 78   Ht 5\' 4"  (1.626 m)   Wt 195 lb (88.5 kg)   LMP 08/07/2015   SpO2 100%   BMI 33.47 kg/m  Wt Readings from Last 3 Encounters:  02/15/21 195 lb (88.5 kg)  01/18/21 199 lb (90.3 kg)  12/21/20 204 lb (92.5 kg)     Health Maintenance Due  Topic Date Due   COVID-19 Vaccine (1) Never done   Zoster Vaccines- Shingrix (  1 of 2) Never done   COLONOSCOPY (Pts 45-70yrs Insurance coverage will need to be confirmed)  09/27/2018    There are no preventive care reminders to display for this patient.  Lab Results  Component Value Date   TSH 0.68 11/06/2017   Lab Results  Component Value Date   WBC 7.0 04/12/2020   HGB 13.4 04/12/2020   HCT 41.7 04/12/2020   MCV 88.7 04/12/2020   PLT 249 04/12/2020   Lab Results  Component Value Date   NA 136 04/12/2020   K 3.7 04/12/2020   CO2 26 04/12/2020   GLUCOSE 108 (H) 04/12/2020   BUN 18 04/12/2020   CREATININE 0.96 04/12/2020   BILITOT 0.3 04/12/2020   ALKPHOS 92 04/12/2020   AST 33 04/12/2020   ALT 28 04/12/2020   PROT 7.3 04/12/2020   ALBUMIN 3.8 04/12/2020   CALCIUM 8.7 (L) 04/12/2020   ANIONGAP 13 04/12/2020   Lab Results  Component Value Date   CHOL 196 10/11/2019   Lab Results  Component Value Date   HDL 68 10/11/2019   Lab Results  Component Value Date   LDLCALC 112 10/11/2019   Lab Results   Component Value Date   TRIG 103 04/12/2020   Lab Results  Component Value Date   CHOLHDL 3.3 11/06/2017   Lab Results  Component Value Date   HGBA1C 5.7 (A) 03/03/2020      Assessment & Plan:  Abnormal weight gain - Patient has lost weight. However, her blood pressure was 141/80. Dr Linford Arnold will advise next steps.   Problem List Items Addressed This Visit   None Visit Diagnoses     Abnormal weight gain    -  Primary       No orders of the defined types were placed in this encounter.   Follow-up: No follow-ups on file.    Kelli Tyler, CMA

## 2021-02-23 NOTE — Progress Notes (Signed)
Left message advising of recommendations.  

## 2021-03-01 ENCOUNTER — Other Ambulatory Visit: Payer: Self-pay

## 2021-03-01 ENCOUNTER — Encounter: Payer: Self-pay | Admitting: Family Medicine

## 2021-03-01 ENCOUNTER — Ambulatory Visit (INDEPENDENT_AMBULATORY_CARE_PROVIDER_SITE_OTHER): Payer: Self-pay | Admitting: Family Medicine

## 2021-03-01 VITALS — BP 131/74 | HR 89 | Ht 64.0 in | Wt 195.0 lb

## 2021-03-01 DIAGNOSIS — F321 Major depressive disorder, single episode, moderate: Secondary | ICD-10-CM

## 2021-03-01 DIAGNOSIS — Z7689 Persons encountering health services in other specified circumstances: Secondary | ICD-10-CM

## 2021-03-01 DIAGNOSIS — J019 Acute sinusitis, unspecified: Secondary | ICD-10-CM

## 2021-03-01 MED ORDER — FLUOXETINE HCL 20 MG PO TABS: ORAL_TABLET | ORAL | 1 refills | Status: DC

## 2021-03-01 MED ORDER — PHENTERMINE HCL 37.5 MG PO CAPS: 37.5000 mg | ORAL_CAPSULE | ORAL | 0 refills | Status: DC

## 2021-03-01 NOTE — Assessment & Plan Note (Signed)
Starting weight: 204 lbs  Current weight:195 Previous weight:195 Change in weight: down 0 lb Goal weight: Dietary goals:cut back on carbs.  Continue to work on portion controlling. Exercisegoals: stay active.  Medication:PHentermine Increase to 37.6 mg cap Follow-up and referrals:4-6 weeks.

## 2021-03-01 NOTE — Assessment & Plan Note (Signed)
Overall she is doing well she does just have a few mild depressive symptoms but her biggest struggle currently is some irritability we discussed options she has never tried Prozac before so recommend start with fluoxetine and just use a low-dose and see if that is helpful especially since the Effexor seems to be excessively sedating.

## 2021-03-01 NOTE — Addendum Note (Signed)
Addended by: Nani Gasser D on: 03/01/2021 04:58 PM   Modules accepted: Orders

## 2021-03-01 NOTE — Progress Notes (Addendum)
Acute Office Visit  Subjective:    Patient ID: Kelli Tyler, female    DOB: [birth year], 55 y.o.   MRN: [id number]  Chief Complaint  Patient presents with   Weight Check   Sinusitis    HPI Patient is in today for Headache and sinus pressure x days for 6 days. Started with a cough in her throat.  No chest sxs. .  No fever o chills.  Her grandchild was sick first, and then her husband became sick.  She says her husband did test for COVID yesterday and he was negative and was treated for sinusitis.  Wants to discuss her Effexor.  It was initially started for hot flashes but then we also eventually ended up going up her on her dose 250 mg.  She says even when she was on the lower dose it made her feel sleepy and sedated so often times she only takes it every 2 or 3 days because of that.  She wants another something else that we could try. She has been more irritable with her husband latly.   Follow-up weight management-she also wanted to discuss the phentermine.  He was doing well on the 15 mg tab and had actually lost about 4 pounds when I last saw her a couple of weeks ago but now she feels like she is plateaued and wants to know if she would do better by increasing her dose.  She admits she has not been portion controlling quite as tightly as she was previously.  Past Medical History:  Diagnosis Date   ADHD    Back pain    Restless leg     Past Surgical History:  Procedure Laterality Date   ABLATION     CHOLECYSTECTOMY     NASAL FRACTURE SURGERY     TUBAL LIGATION      Family History  Problem Relation Age of Onset   Aneurysm Mother 7   Stroke Mother 68   Colon cancer Father 44   Breast cancer Maternal Aunt    Breast cancer Maternal Grandmother    Heart attack Brother     Social History   Socioeconomic History   Marital status: Married    Spouse name: Not on file   Number of children: Not on file   Years of education: Not on file   Highest education level: Not on  file  Occupational History   Not on file  Tobacco Use   Smoking status: Former    Packs/day: 1.00    Types: Cigarettes    Quit date: 07/29/2019    Years since quitting: 1.5   Smokeless tobacco: Former  Building services engineer Use: Never used  Substance and Sexual Activity   Alcohol use: Yes   Drug use: No   Sexual activity: Yes    Birth control/protection: Surgical  Other Topics Concern   Not on file  Social History Narrative   Not on file   Social Determinants of Health   Financial Resource Strain: Not on file  Food Insecurity: Not on file  Transportation Needs: No Transportation Needs   Lack of Transportation (Medical): No   Lack of Transportation (Non-Medical): No  Physical Activity: Not on file  Stress: Not on file  Social Connections: Not on file  Intimate Partner Violence: Not on file    Outpatient Medications Prior to Visit  Medication Sig Dispense Refill   phentermine 15 MG capsule Take 1 capsule (15 mg total) by mouth every morning. 30  capsule 0   venlafaxine XR (EFFEXOR-XR) 150 MG 24 hr capsule Take 150 mg by mouth every other day.     No facility-administered medications prior to visit.    Allergies  Allergen Reactions   Effexor [Venlafaxine] Other (See Comments)    Sedation   Wellbutrin [Bupropion] Other (See Comments)    Back Pain    Review of Systems     Objective:    Physical Exam Constitutional:      Appearance: She is well-developed.  HENT:     Head: Normocephalic and atraumatic.     Right Ear: External ear normal.     Left Ear: External ear normal.     Nose: Nose normal.  Eyes:     Conjunctiva/sclera: Conjunctivae normal.     Pupils: Pupils are equal, round, and reactive to light.  Neck:     Thyroid: No thyromegaly.  Cardiovascular:     Rate and Rhythm: Normal rate and regular rhythm.     Heart sounds: Normal heart sounds.  Pulmonary:     Effort: Pulmonary effort is normal.     Breath sounds: Normal breath sounds. No wheezing.   Musculoskeletal:     Cervical back: Neck supple.  Lymphadenopathy:     Cervical: No cervical adenopathy.  Skin:    General: Skin is warm and dry.  Neurological:     Mental Status: She is alert and oriented to person, place, and time.    BP 131/74   Pulse 89   Ht 5\' 4"  (1.626 m)   Wt 195 lb (88.5 kg)   LMP 08/07/2015   SpO2 96%   BMI 33.47 kg/m  Wt Readings from Last 3 Encounters:  03/01/21 195 lb (88.5 kg)  02/15/21 195 lb (88.5 kg)  01/18/21 199 lb (90.3 kg)    There are no preventive care reminders to display for this patient.   There are no preventive care reminders to display for this patient.   Lab Results  Component Value Date   TSH 0.68 11/06/2017   Lab Results  Component Value Date   WBC 7.0 04/12/2020   HGB 13.4 04/12/2020   HCT 41.7 04/12/2020   MCV 88.7 04/12/2020   PLT 249 04/12/2020   Lab Results  Component Value Date   NA 136 04/12/2020   K 3.7 04/12/2020   CO2 26 04/12/2020   GLUCOSE 108 (H) 04/12/2020   BUN 18 04/12/2020   CREATININE 0.96 04/12/2020   BILITOT 0.3 04/12/2020   ALKPHOS 92 04/12/2020   AST 33 04/12/2020   ALT 28 04/12/2020   PROT 7.3 04/12/2020   ALBUMIN 3.8 04/12/2020   CALCIUM 8.7 (L) 04/12/2020   ANIONGAP 13 04/12/2020   Lab Results  Component Value Date   CHOL 196 10/11/2019   Lab Results  Component Value Date   HDL 68 10/11/2019   Lab Results  Component Value Date   LDLCALC 112 10/11/2019   Lab Results  Component Value Date   TRIG 103 04/12/2020   Lab Results  Component Value Date   CHOLHDL 3.3 11/06/2017   Lab Results  Component Value Date   HGBA1C 5.7 (A) 03/03/2020       Assessment & Plan:   Problem List Items Addressed This Visit       Other   Encounter for weight management    Starting weight: 204 lbs    Current weight:195 Previous weight:195 Change in weight: down 0 lb Goal weight: Dietary goals: cut back on carbs.  Continue  to work on portion controlling. Exercise goals: stay  active.  Medication: PHentermine Increase to 37.6 mg cap  Follow-up and referrals: 4-6 weeks.         Relevant Medications   phentermine 37.5 MG capsule   Depression, major, single episode, moderate (HCC)    Overall she is doing well she does just have a few mild depressive symptoms but her biggest struggle currently is some irritability we discussed options she has never tried Prozac before so recommend start with fluoxetine and just use a low-dose and see if that is helpful especially since the Effexor seems to be excessively sedating.       Relevant Medications   FLUoxetine (PROZAC) 20 MG tablet   Other Visit Diagnoses     Acute non-recurrent sinusitis, unspecified location    -  Primary   Relevant Orders   Novel Coronavirus, NAA ([facility])      Acute sinusitis-viral versus bacterial she is only had symptoms for 6 days at this point we did discuss testing for COVID at least initially and if negative then considering a trial of antibiotics at that point  Meds ordered this encounter  Medications   FLUoxetine (PROZAC) 20 MG tablet    Sig: Take 0.5 tablets (10 mg total) by mouth daily for 10 days, THEN 1 tablet (20 mg total) daily for 20 days.    Dispense:  30 tablet    Refill:  1   phentermine 37.5 MG capsule    Sig: Take 1 capsule (37.5 mg total) by mouth every morning.    Dispense:  30 capsule    Refill:  0     Nani Gasser, MD

## 2021-03-02 ENCOUNTER — Telehealth: Payer: Self-pay | Admitting: *Deleted

## 2021-03-02 LAB — NOVEL CORONAVIRUS, NAA: SARS-CoV-2, NAA: NOT DETECTED

## 2021-03-02 LAB — SARS-COV-2, NAA 2 DAY TAT

## 2021-03-02 MED ORDER — AZITHROMYCIN 250 MG PO TABS: ORAL_TABLET | ORAL | 0 refills | Status: AC

## 2021-03-02 NOTE — Telephone Encounter (Signed)
Kelli Tyler called stating that the Phentermine capsule is not in stock wanted to know if it would be ok to switch to tablet.  Gave ok to switch to Phentermine 37.5 mg tablet.

## 2021-03-02 NOTE — Addendum Note (Signed)
Addended by: Nani Gasser D on: 03/02/2021 05:40 PM   Modules accepted: Orders

## 2021-03-16 ENCOUNTER — Telehealth: Payer: Self-pay | Admitting: Family Medicine

## 2021-04-05 ENCOUNTER — Ambulatory Visit: Payer: Self-pay | Admitting: Family Medicine

## 2021-06-12 ENCOUNTER — Other Ambulatory Visit: Payer: Self-pay | Admitting: Family Medicine

## 2021-07-25 ENCOUNTER — Other Ambulatory Visit: Payer: Self-pay | Admitting: Family Medicine

## 2021-07-25 NOTE — Telephone Encounter (Signed)
LVM informing pt of refill and need for f/u in January.  Advised pt to call to schedule an appointment.  Tiajuana Amass, CMA

## 2021-07-25 NOTE — Telephone Encounter (Signed)
Medication refill but just remind her she is actually due in January for her 50-month follow-up for chronic pain management.

## 2021-08-22 IMAGING — DX DG THORACIC SPINE 3V
3 series · 3 of 3 positions shown · non-contrast
Comparison: None.

CLINICAL DATA: Mid back pain

EXAM:
THORACIC SPINE - 3 VIEWS

[t-spine ap]
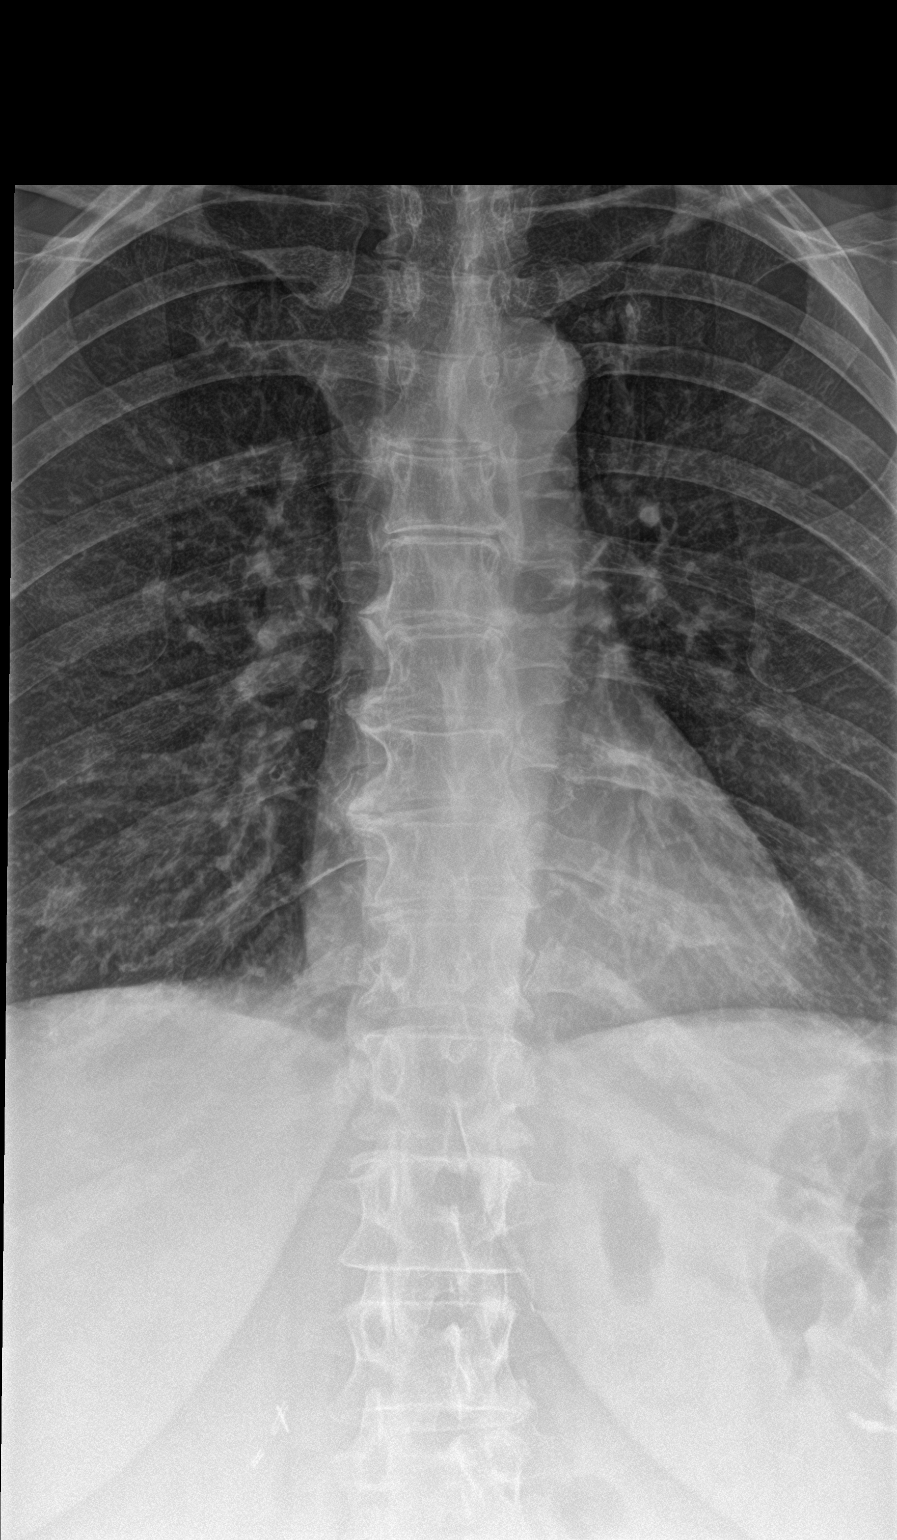

[t-spine lat]
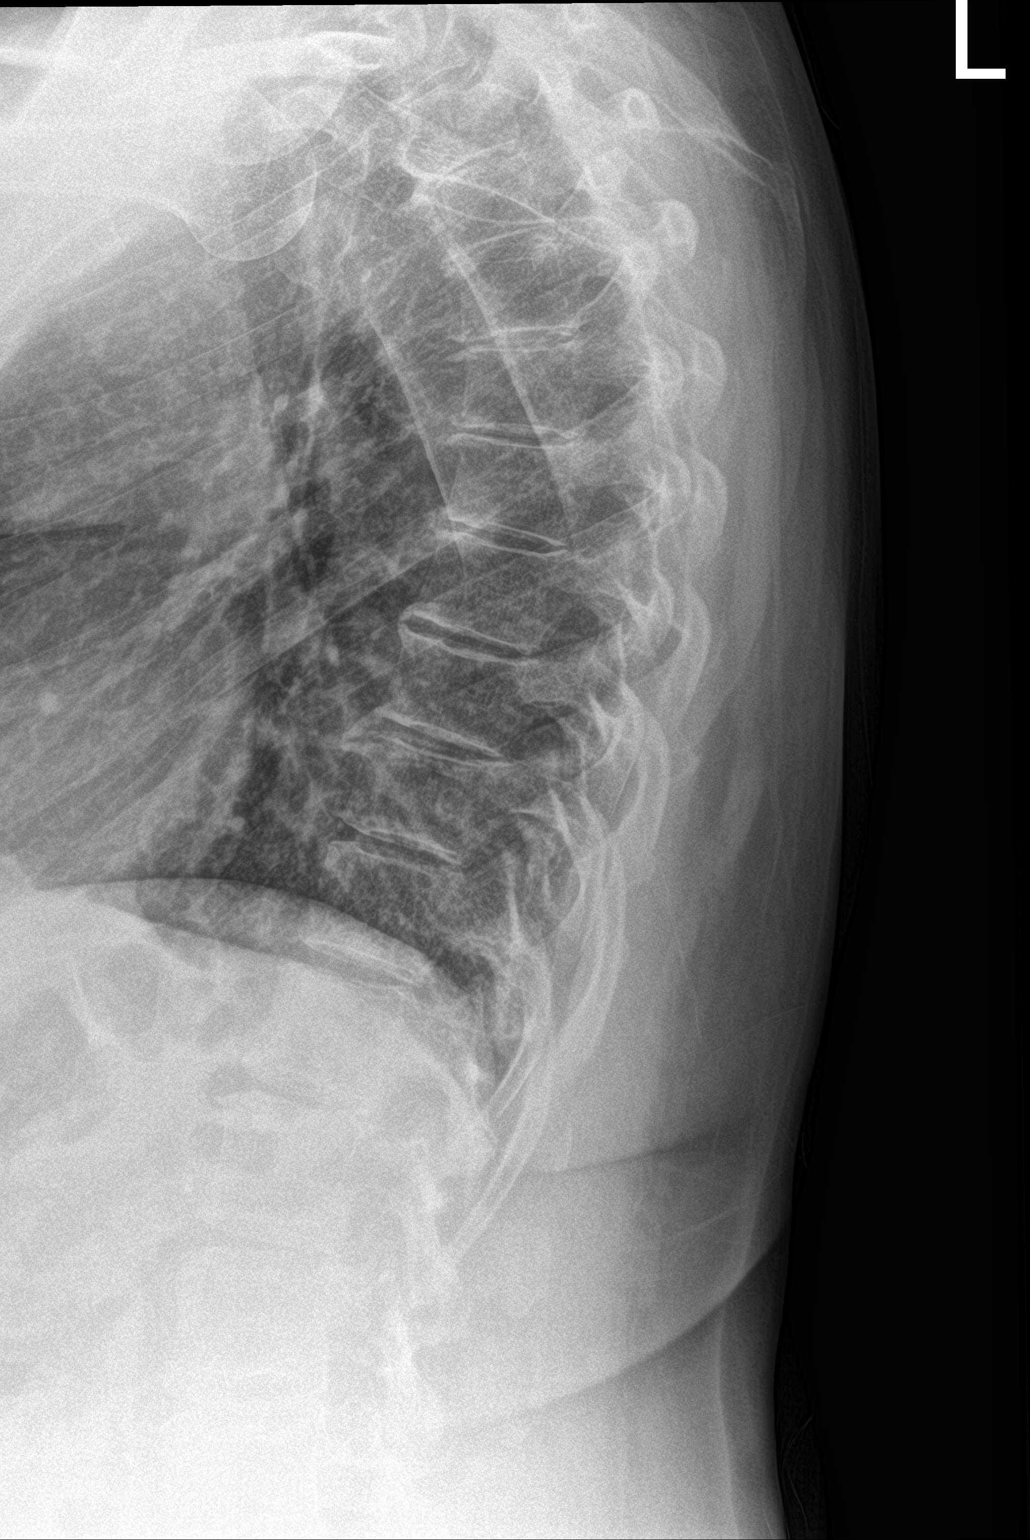

[t-spine swimmers]
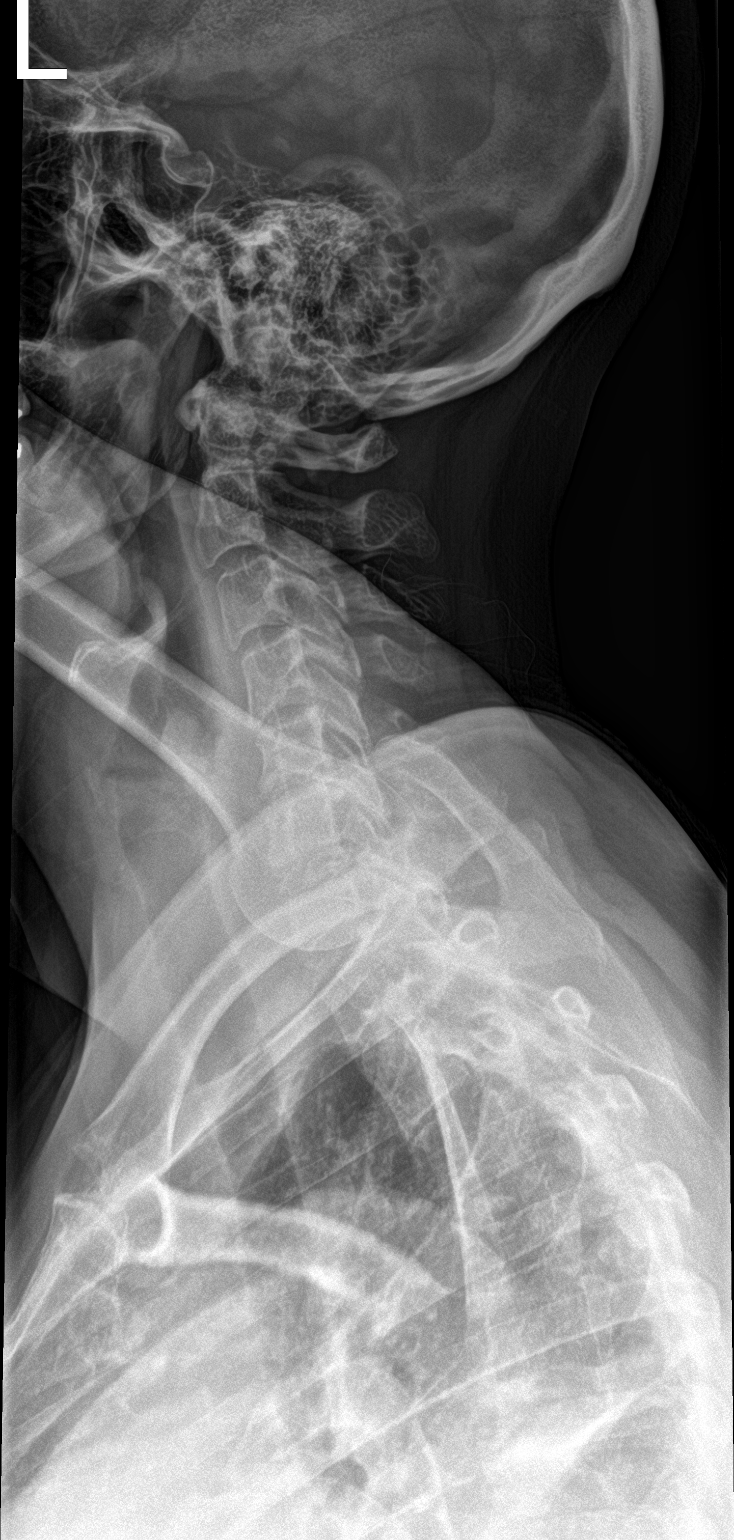

[3 of 3 positions shown; findings below may reference images not displayed]

FINDINGS: Vertebral body height is well maintained. Mild osteophytic changes
are seen. No pedicle abnormality or paraspinal mass is seen. No
definitive rib abnormality is noted.
IMPRESSION: Mild degenerative change without acute abnormality.

## 2021-08-22 IMAGING — DX DG LUMBAR SPINE 2-3V
3 series · 3 of 3 positions shown · non-contrast
Comparison: 12/08/2017

CLINICAL DATA: Low back pain, no known injury, initial encounter

EXAM:
LUMBAR SPINE - 3 VIEW

[l-spine ap]
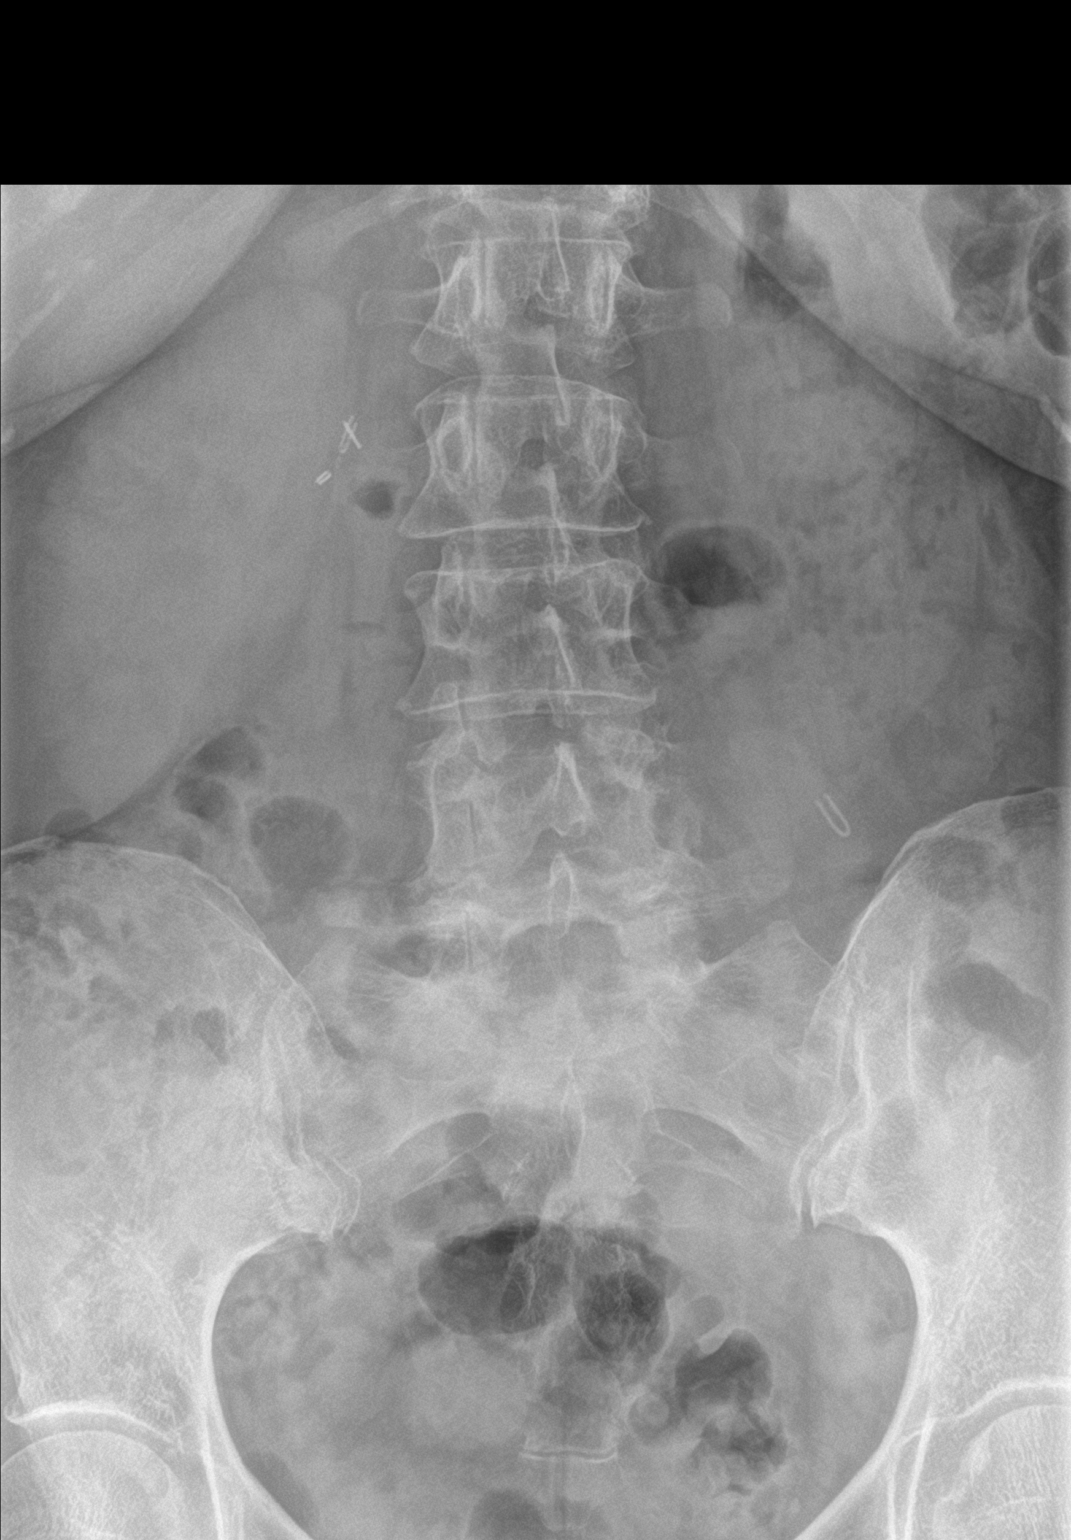

[l-spine lat]
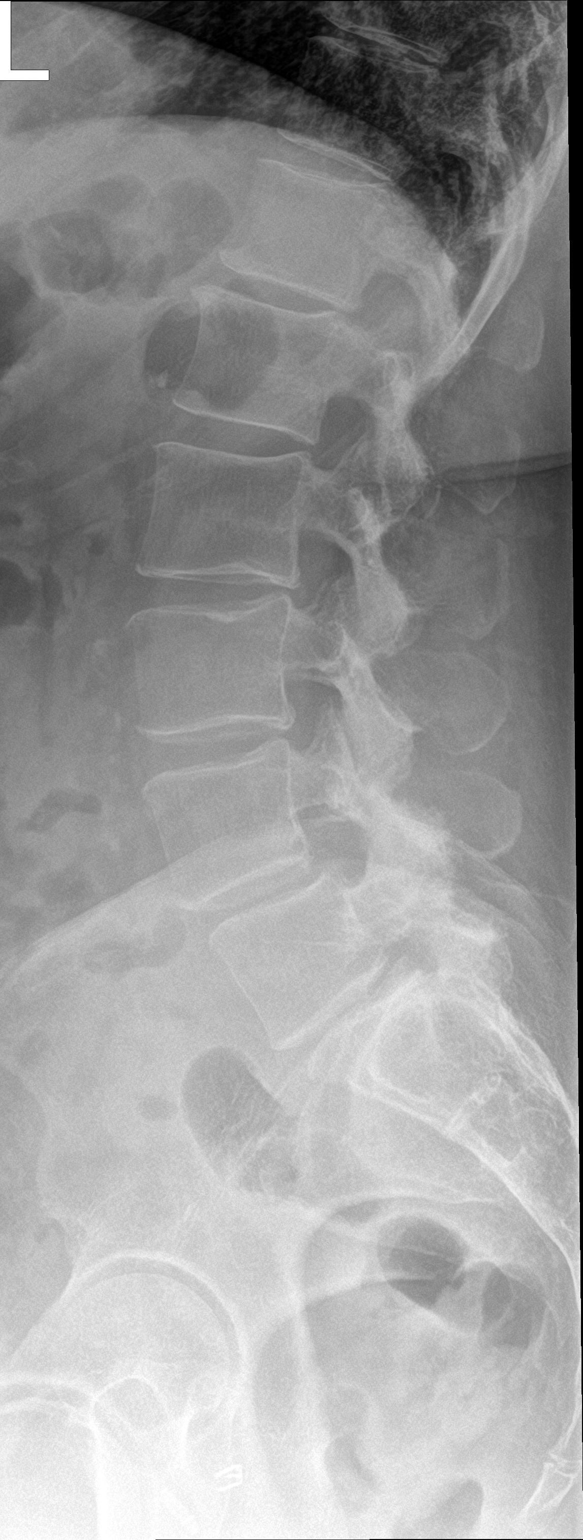

[l-spine spot]
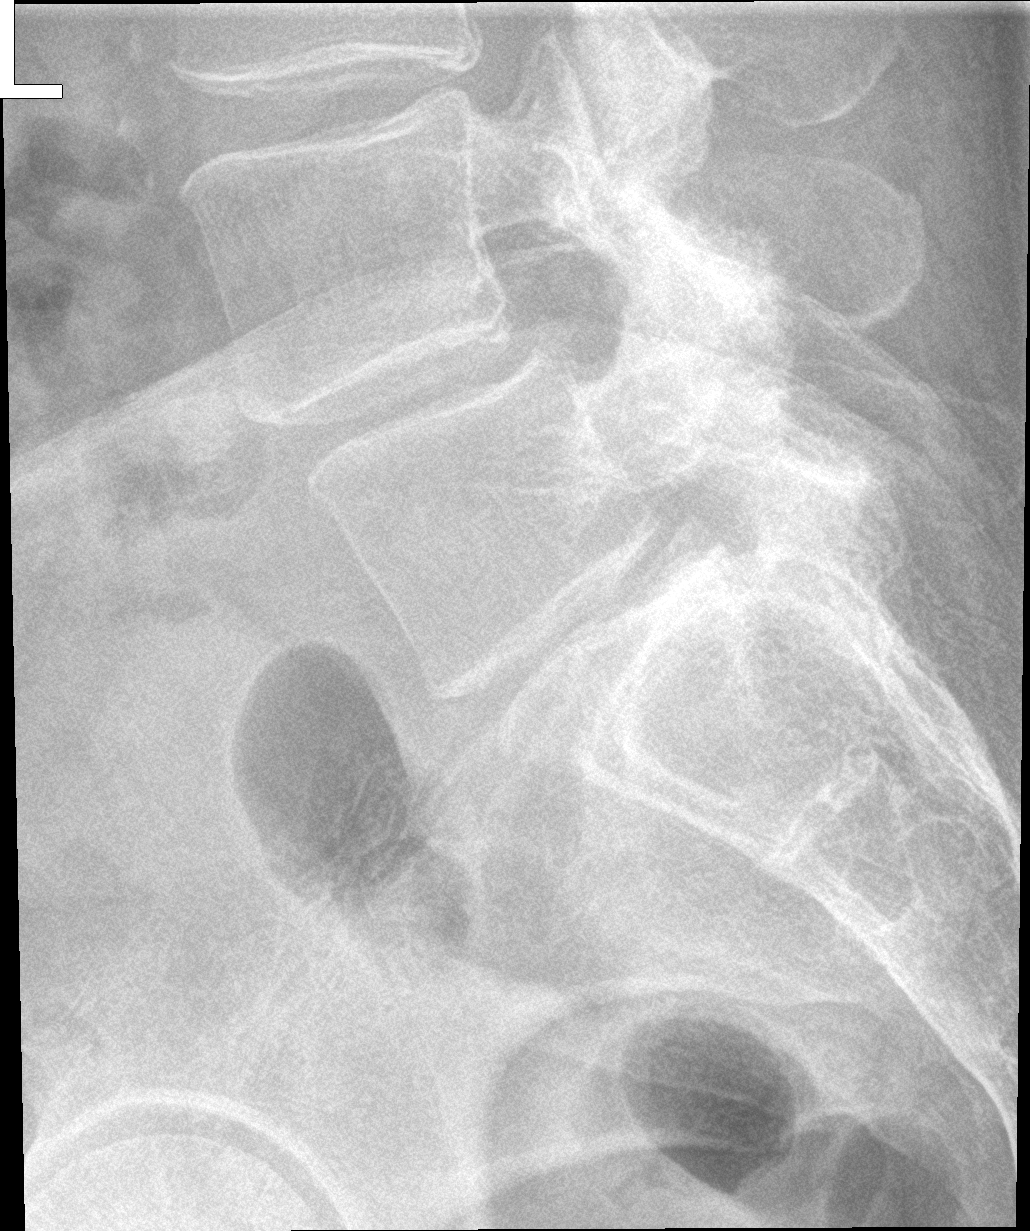

[3 of 3 positions shown; findings below may reference images not displayed]

FINDINGS: Five non rib-bearing lumbar type vertebral bodies are well
visualized. Vertebral body height is well maintained. Degenerative
changes are noted in the facet joints with mild anterolisthesis of
L4 on L5 and L5 on S1. Disc space narrowing at L4-5 and L5-S1 is
noted.
IMPRESSION: Progression in degenerative change in the facet joints with new
anterolisthesis of L4 on L5 and L5 on S1.

## 2021-08-30 ENCOUNTER — Other Ambulatory Visit: Payer: Self-pay | Admitting: Family Medicine

## 2021-08-30 NOTE — Telephone Encounter (Signed)
[facility] - "Maximum MME cannot be calculated for this prescription. Enter discrete sig details to calculate maximum MME".

## 2021-08-31 ENCOUNTER — Other Ambulatory Visit: Payer: Self-pay | Admitting: Family Medicine

## 2021-08-31 ENCOUNTER — Telehealth: Payer: Self-pay | Admitting: Family Medicine

## 2021-08-31 NOTE — Telephone Encounter (Signed)
Left message for patient to return call to schedule 6 month follow up.

## 2021-09-03 NOTE — Telephone Encounter (Signed)
Pt has appt 09/13/2021 at 9:10AM with Dr. Linford Arnold

## 2021-09-05 ENCOUNTER — Other Ambulatory Visit: Payer: Self-pay

## 2021-09-05 MED ORDER — TRAMADOL HCL 50 MG PO TABS: ORAL_TABLET | ORAL | 0 refills | Status: DC

## 2021-09-05 NOTE — Telephone Encounter (Signed)
Call patient: Prescription sent for tramadol but this will be her last 1 she has to have a 58-month follow-up for pain management we are not allowed to continue to prescribe.

## 2021-09-05 NOTE — Telephone Encounter (Signed)
Patient states she has an appointment on the 26 of this month.

## 2021-09-13 ENCOUNTER — Other Ambulatory Visit: Payer: Self-pay

## 2021-09-13 ENCOUNTER — Ambulatory Visit (INDEPENDENT_AMBULATORY_CARE_PROVIDER_SITE_OTHER): Payer: Self-pay | Admitting: Family Medicine

## 2021-09-13 ENCOUNTER — Encounter: Payer: Self-pay | Admitting: Family Medicine

## 2021-09-13 ENCOUNTER — Ambulatory Visit (INDEPENDENT_AMBULATORY_CARE_PROVIDER_SITE_OTHER): Payer: Self-pay

## 2021-09-13 VITALS — BP 146/83 | HR 73 | Ht 64.0 in | Wt 196.0 lb

## 2021-09-13 DIAGNOSIS — M5441 Lumbago with sciatica, right side: Secondary | ICD-10-CM

## 2021-09-13 DIAGNOSIS — R0789 Other chest pain: Secondary | ICD-10-CM

## 2021-09-13 DIAGNOSIS — R7301 Impaired fasting glucose: Secondary | ICD-10-CM

## 2021-09-13 DIAGNOSIS — R0602 Shortness of breath: Secondary | ICD-10-CM

## 2021-09-13 DIAGNOSIS — Z8659 Personal history of other mental and behavioral disorders: Secondary | ICD-10-CM

## 2021-09-13 DIAGNOSIS — G8929 Other chronic pain: Secondary | ICD-10-CM

## 2021-09-13 LAB — COMPLETE METABOLIC PANEL WITH GFR
AG Ratio: 1.4 (calc) (ref 1.0–2.5)
ALT: 21 U/L (ref 6–29)
AST: 16 U/L (ref 10–35)
Albumin: 4.4 g/dL (ref 3.6–5.1)
Alkaline phosphatase (APISO): 127 U/L (ref 37–153)
BUN: 13 mg/dL (ref 7–25)
CO2: 31 mmol/L (ref 20–32)
Calcium: 10 mg/dL (ref 8.6–10.4)
Chloride: 101 mmol/L (ref 98–110)
Creat: 0.69 mg/dL (ref 0.50–1.03)
Globulin: 3.1 g/dL (calc) (ref 1.9–3.7)
Glucose, Bld: 85 mg/dL (ref 65–139)
Potassium: 4.9 mmol/L (ref 3.5–5.3)
Sodium: 138 mmol/L (ref 135–146)
Total Bilirubin: 0.4 mg/dL (ref 0.2–1.2)
Total Protein: 7.5 g/dL (ref 6.1–8.1)
eGFR: 104 mL/min/{1.73_m2} (ref >=60)

## 2021-09-13 LAB — CBC
HCT: 42.9 % (ref 35.0–45.0)
Hemoglobin: 13.9 g/dL (ref 11.7–15.5)
MCH: 27.4 pg (ref 27.0–33.0)
MCHC: 32.4 g/dL (ref 32.0–36.0)
MCV: 84.4 fL (ref 80.0–100.0)
MPV: 9.6 fL (ref 7.5–12.5)
Platelets: 437 10*3/uL — ABNORMAL HIGH (ref 140–400)
RBC: 5.08 10*6/uL (ref 3.80–5.10)
RDW: 13.8 % (ref 11.0–15.0)
WBC: 9 10*3/uL (ref 3.8–10.8)

## 2021-09-13 LAB — LIPID PANEL W/REFLEX DIRECT LDL
Cholesterol: 196 mg/dL (ref <200)
HDL: 62 mg/dL (ref >=50)
LDL Cholesterol (Calc): 108 mg/dL (calc) — ABNORMAL HIGH
Non-HDL Cholesterol (Calc): 134 mg/dL (calc) — ABNORMAL HIGH (ref <130)
Total CHOL/HDL Ratio: 3.2 (calc) (ref <5.0)
Triglycerides: 148 mg/dL (ref <150)

## 2021-09-13 LAB — POCT GLYCOSYLATED HEMOGLOBIN (HGB A1C): Hemoglobin A1C: 5.7 % — AB (ref 4.0–5.6)

## 2021-09-13 LAB — TSH: TSH: 1.59 mIU/L

## 2021-09-13 MED ORDER — TRAMADOL HCL 50 MG PO TABS: ORAL_TABLET | ORAL | 0 refills | Status: DC

## 2021-09-13 NOTE — Assessment & Plan Note (Signed)
A1c looks great today at 5.7.  Continue with healthy lifestyle choices and continue to monitor.

## 2021-09-13 NOTE — Assessment & Plan Note (Addendum)
Indication for chronic opioid: Chronic bilateral low back pain with right-sided sciatica. Medication and dose: Tramadol 50 mg. # pills per month: 30 Last UDS date:  Opioid Treatment Agreement signed (Y/N): Y Opioid Treatment Agreement last reviewed with patient:  Y [facility] reviewed this encounter (include red flags):  Jeannie Fend

## 2021-09-13 NOTE — Progress Notes (Signed)
Established Patient Office Visit  Subjective:  Patient ID: Kelli Tyler, female    DOB: Mar 04, 1966  Age: 56 y.o. MRN: [id number]  CC:  Chief Complaint  Patient presents with   Follow-up    [facility]   Impaired fasting glucose-no increased thirst or urination. No symptoms consistent with hypoglycemia.  F/U depression -she is actually doing well and declines any significant symptoms.  She is no longer on the fluoxetine she took herself off of it.  Overdue for labs. Never went to lab last summer to have them drawn.    F/U Chronic pain mgt  -she is currently on tramadol.  She said she tried alternating it with Tylenol but really did not notice a difference so quit using the Tylenol.  She says she only uses tramadol on the days that she works.  She also reports that she has been having some shortness of breath particularly with going up stairs and increased activity she has noticed it for maybe 3 or 4 weeks.  Around the time that this started she did have a cold she said she had a sore throat and a cough.  She says it was mild enough that she actually continue to work and the symptoms did get better.  She has been under a lot of stress recently.  She says she does get a little bit of chest discomfort on the right side of her chest with the shortness of breath.  As soon as she stops to rest and catches her breath it feels better and it goes away.  She has not had any residual cough.  She did recently start taking an over-the-counter sleep aid.  Past Medical History:  Diagnosis Date   ADHD    Back pain    Restless leg     Past Surgical History:  Procedure Laterality Date   ABLATION     CHOLECYSTECTOMY     NASAL FRACTURE SURGERY     TUBAL LIGATION      Family History  Problem Relation Age of Onset   Aneurysm Mother 5053   Stroke Mother 2653   Colon cancer Father [id number]   Breast cancer Maternal Aunt    Breast cancer Maternal Grandmother    Heart attack Brother      Social History   Socioeconomic History   Marital status: Married    Spouse name: Not on file   Number of children: Not on file   Years of education: Not on file   Highest education level: Not on file  Occupational History   Not on file  Tobacco Use   Smoking status: Former    Packs/day: 1.00    Types: Cigarettes    Quit date: 07/29/2019    Years since quitting: 2.1   Smokeless tobacco: Former  Building services engineerVaping Use   Vaping Use: Never used  Substance and Sexual Activity   Alcohol use: Yes   Drug use: No   Sexual activity: Yes    Birth control/protection: Surgical  Other Topics Concern   Not on file  Social History Narrative   Not on file   Social Determinants of Health   Financial Resource Strain: Not on file  Food Insecurity: Not on file  Transportation Needs: Not on file  Physical Activity: Not on file  Stress: Not on file  Social Connections: Not on file  Intimate Partner Violence: Not on file    Outpatient Medications Prior to Visit  Medication Sig Dispense Refill   traMADol (  ULTRAM) 50 MG tablet TAKE ONE TABLET BY MOUTH THREE TIMES A DAY AS NEEDED FOR PAIN 30 tablet 0   FLUoxetine (PROZAC) 20 MG tablet Take 0.5 tablets (10 mg total) by mouth daily for 10 days, THEN 1 tablet (20 mg total) daily for 20 days. 30 tablet 1   phentermine 37.5 MG capsule Take 1 capsule (37.5 mg total) by mouth every morning. 30 capsule 0   No facility-administered medications prior to visit.    Allergies  Allergen Reactions   Effexor [Venlafaxine] Other (See Comments)    Sedation   Wellbutrin [Bupropion] Other (See Comments)    Back Pain    ROS Review of Systems    Objective:    Physical Exam Constitutional:      Appearance: Normal appearance. She is well-developed.  HENT:     Head: Normocephalic and atraumatic.  Cardiovascular:     Rate and Rhythm: Normal rate and regular rhythm.     Heart sounds: Normal heart sounds.  Pulmonary:     Effort: Pulmonary effort is  normal.     Breath sounds: Normal breath sounds.  Musculoskeletal:     Cervical back: Neck supple. No tenderness.  Lymphadenopathy:     Cervical: No cervical adenopathy.  Skin:    General: Skin is warm and dry.  Neurological:     Mental Status: She is alert and oriented to person, place, and time.  Psychiatric:        Behavior: Behavior normal.    BP (!) 146/83    Pulse 73    Ht 5\' 4"  (1.626 m)    Wt 196 lb (88.9 kg)    LMP 08/07/2015    SpO2 100%    BMI 33.64 kg/m  Wt Readings from Last 3 Encounters:  09/13/21 196 lb (88.9 kg)  03/01/21 195 lb (88.5 kg)  02/15/21 195 lb (88.5 kg)     Health Maintenance Due  Topic Date Due   Hepatitis C Screening  Never done    There are no preventive care reminders to display for this patient.  Lab Results  Component Value Date   TSH 0.68 11/06/2017   Lab Results  Component Value Date   WBC 7.0 04/12/2020   HGB 13.4 04/12/2020   HCT 41.7 04/12/2020   MCV 88.7 04/12/2020   PLT 249 04/12/2020   Lab Results  Component Value Date   NA 136 04/12/2020   K 3.7 04/12/2020   CO2 26 04/12/2020   GLUCOSE 108 (H) 04/12/2020   BUN 18 04/12/2020   CREATININE 0.96 04/12/2020   BILITOT 0.3 04/12/2020   ALKPHOS 92 04/12/2020   AST 33 04/12/2020   ALT 28 04/12/2020   PROT 7.3 04/12/2020   ALBUMIN 3.8 04/12/2020   CALCIUM 8.7 (L) 04/12/2020   ANIONGAP 13 04/12/2020   Lab Results  Component Value Date   CHOL 196 10/11/2019   Lab Results  Component Value Date   HDL 68 10/11/2019   Lab Results  Component Value Date   LDLCALC 112 10/11/2019   Lab Results  Component Value Date   TRIG 103 04/12/2020   Lab Results  Component Value Date   CHOLHDL 3.3 11/06/2017   Lab Results  Component Value Date   HGBA1C 5.7 (A) 03/03/2020      Assessment & Plan:   Problem List Items Addressed This Visit       Endocrine   IFG (impaired fasting glucose) - Primary    A1c looks great today at 5.7.  Continue with healthy lifestyle  choices and continue to monitor.        Nervous and Auditory   Chronic bilateral low back pain with right-sided sciatica    See note below under chronic pain management.      Relevant Medications   traMADol (ULTRAM) 50 MG tablet (Start on 10/05/2021)     Other   History of depression   Encounter for chronic pain management    Indication for chronic opioid: Chronic bilateral low back pain with right-sided sciatica. Medication and dose: Tramadol 50 mg. # pills per month: 30 Last UDS date:  Opioid Treatment Agreement signed (Y/N): Y Opioid Treatment Agreement last reviewed with patient:  Y NCCSRS reviewed this encounter (include red flags):  Y       Other Visit Diagnoses     Atypical chest pain       Relevant Orders   DG Chest 2 View   Lipid Panel w/reflex Direct LDL   COMPLETE METABOLIC PANEL WITH GFR   CBC   TSH   SOB (shortness of breath)       Relevant Orders   DG Chest 2 View   Lipid Panel w/reflex Direct LDL   COMPLETE METABOLIC PANEL WITH GFR   CBC   TSH       Atypical chest pain with shortness of breath-discussed further work-up including EKG, chest x-ray and labs.  EKG today shows rate of 71 bpm, normal sinus rhythm with no acute ST-T wave changes.  Meds ordered this encounter  Medications   traMADol (ULTRAM) 50 MG tablet    Sig: TAKE ONE TABLET BY MOUTH THREE TIMES A DAY AS NEEDED FOR PAIN    Dispense:  30 tablet    Refill:  0    Follow-up: Return in about 6 months (around 03/13/2022) for Pain medications.    Nani Gasser, MD

## 2021-09-13 NOTE — Assessment & Plan Note (Signed)
See note below under chronic pain management.

## 2021-09-13 NOTE — Addendum Note (Signed)
Addended by: Osborne Oman on: 09/13/2021 01:31 PM   Modules accepted: Orders

## 2021-09-14 NOTE — Progress Notes (Signed)
Hi Kelli Tyler, platelets are elevated.  Sometimes this can happen if there is inflammation in the body but I do want to recheck this in about a month.  Otherwise though your hemoglobin is normal no sign of infection.  Your LDL cholesterol is up just a little bit.  Just continue to work on healthy diet and regular exercise.  You have great HDL which is the good cholesterol so that is wonderful.  The [facility] panel is normal.  Thyroid looks great.

## 2021-09-14 NOTE — Progress Notes (Signed)
Hi Kelli Tyler, chest x-ray actually looks good.  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

## 2021-09-14 NOTE — Addendum Note (Signed)
Addended by: Chalmers Cater on: 09/14/2021 10:23 AM   Modules accepted: Orders

## 2021-10-18 ENCOUNTER — Ambulatory Visit (INDEPENDENT_AMBULATORY_CARE_PROVIDER_SITE_OTHER): Payer: Self-pay | Admitting: Family Medicine

## 2021-10-18 ENCOUNTER — Encounter: Payer: Self-pay | Admitting: Family Medicine

## 2021-10-18 ENCOUNTER — Other Ambulatory Visit: Payer: Self-pay

## 2021-10-18 VITALS — BP 134/71 | HR 67 | Resp 16 | Ht 64.0 in | Wt 199.0 lb

## 2021-10-18 DIAGNOSIS — G8929 Other chronic pain: Secondary | ICD-10-CM

## 2021-10-18 DIAGNOSIS — R0789 Other chest pain: Secondary | ICD-10-CM

## 2021-10-18 DIAGNOSIS — R7301 Impaired fasting glucose: Secondary | ICD-10-CM

## 2021-10-18 DIAGNOSIS — R7989 Other specified abnormal findings of blood chemistry: Secondary | ICD-10-CM

## 2021-10-18 LAB — CBC WITH DIFFERENTIAL/PLATELET
Absolute Monocytes: 772 cells/uL (ref 200–950)
Basophils Absolute: 74 cells/uL (ref 0–200)
Basophils Relative: 0.8 %
Eosinophils Absolute: 270 cells/uL (ref 15–500)
Eosinophils Relative: 2.9 %
HCT: 42.6 % (ref 35.0–45.0)
Hemoglobin: 13.6 g/dL (ref 11.7–15.5)
Lymphs Abs: 2920 cells/uL (ref 850–3900)
MCH: 27.2 pg (ref 27.0–33.0)
MCHC: 31.9 g/dL — ABNORMAL LOW (ref 32.0–36.0)
MCV: 85.2 fL (ref 80.0–100.0)
MPV: 9.7 fL (ref 7.5–12.5)
Monocytes Relative: 8.3 %
Neutro Abs: 5264 cells/uL (ref 1500–7800)
Neutrophils Relative %: 56.6 %
Platelets: 420 10*3/uL — ABNORMAL HIGH (ref 140–400)
RBC: 5 10*6/uL (ref 3.80–5.10)
RDW: 14.1 % (ref 11.0–15.0)
Total Lymphocyte: 31.4 %
WBC: 9.3 10*3/uL (ref 3.8–10.8)

## 2021-10-18 MED ORDER — DULOXETINE HCL 30 MG PO CPEP: 30.0000 mg | ORAL_CAPSULE | Freq: Every day | ORAL | 0 refills | Status: DC

## 2021-10-18 NOTE — Progress Notes (Addendum)
? ?Established Patient Office Visit ? ?Subjective:  ?Patient ID: Kelli Tyler, female    DOB: [birth year]  Age: 56 y.o. MRN: [id number] ? ?CC:  ?Chief Complaint  ?Patient presents with  ? Follow-up  ?  1 month to recheck Platelets   ? ? ?HPI ?Kelli Tyler presents for high platelets.   ? ?She is stil having some chest pain.  She says it still on and off.  EKG was normal and labs were unrevealing.  But she is still noticing it intermittently. ? ?Says she thinks she would like to get back on Cymbalta as she had taken it years ago for her fibromyalgia it was prescribed by neurology at that time.  But she thinks it could really be helpful with some of her chronic pain and potentially help reduce how often she is using her ibuprofen she is worried about the long-term effects of high NSAID use.  Also been working on cutting back on sugar intake she says she noticed that she does feel better and has less pain when she eats less sugar. ? ?He also feels a little irritable and on edge and has not been sleeping well lately she has been using Benadryl to help her sleep.  She says melatonin tends to make her back hurt so she avoids that 1. ? ?Past Medical History:  ?Diagnosis Date  ? ADHD   ? Back pain   ? Restless leg   ? ? ?Past Surgical History:  ?Procedure Laterality Date  ? ABLATION    ? CHOLECYSTECTOMY    ? NASAL FRACTURE SURGERY    ? TUBAL LIGATION    ? ? ?Family History  ?Problem Relation Age of Onset  ? Aneurysm Mother 75  ? Stroke Mother 66  ? Colon cancer Father 66  ? Breast cancer Maternal Aunt   ? Breast cancer Maternal Grandmother   ? Heart attack Brother   ? ? ?Social History  ? ?Socioeconomic History  ? Marital status: Married  ?  Spouse name: Not on file  ? Number of children: Not on file  ? Years of education: Not on file  ? Highest education level: Not on file  ?Occupational History  ? Not on file  ?Tobacco Use  ? Smoking status: Former  ?  Packs/day: 1.00  ?  Types: Cigarettes  ?  Quit date: 07/29/2019  ?   Years since quitting: 2.2  ? Smokeless tobacco: Former  ?Vaping Use  ? Vaping Use: Never used  ?Substance and Sexual Activity  ? Alcohol use: Yes  ? Drug use: No  ? Sexual activity: Yes  ?  Birth control/protection: Surgical  ?Other Topics Concern  ? Not on file  ?Social History Narrative  ? Not on file  ? ?Social Determinants of Health  ? ?Financial Resource Strain: Not on file  ?Food Insecurity: Not on file  ?Transportation Needs: Not on file  ?Physical Activity: Not on file  ?Stress: Not on file  ?Social Connections: Not on file  ?Intimate Partner Violence: Not on file  ? ? ?Outpatient Medications Prior to Visit  ?Medication Sig Dispense Refill  ? traMADol (ULTRAM) 50 MG tablet TAKE ONE TABLET BY MOUTH THREE TIMES A DAY AS NEEDED FOR PAIN 30 tablet 0  ? ?No facility-administered medications prior to visit.  ? ? ?Allergies  ?Allergen Reactions  ? Effexor [Venlafaxine] Other (See Comments)  ?  Sedation  ? Wellbutrin [Bupropion] Other (See Comments)  ?  Back Pain  ? ? ?ROS ?Review of  Systems ? ?  ?Objective:  ?  ?Physical Exam ? ?BP 134/71   Pulse 67   Resp 16   Ht '5\' 4"'  (1.626 m)   Wt 199 lb (90.3 kg)   LMP 08/07/2015   SpO2 98%   BMI 34.16 kg/m?  ?Wt Readings from Last 3 Encounters:  ?10/18/21 199 lb (90.3 kg)  ?09/13/21 196 lb (88.9 kg)  ?03/01/21 195 lb (88.5 kg)  ? ? ? ?Health Maintenance Due  ?Topic Date Due  ? Hepatitis C Screening  Never done  ? ? ?There are no preventive care reminders to display for this patient. ? ?Lab Results  ?Component Value Date  ? TSH 1.59 09/13/2021  ? ?Lab Results  ?Component Value Date  ? WBC 9.3 10/18/2021  ? HGB 13.6 10/18/2021  ? HCT 42.6 10/18/2021  ? MCV 85.2 10/18/2021  ? PLT 420 (H) 10/18/2021  ? ?Lab Results  ?Component Value Date  ? NA 138 09/13/2021  ? K 4.9 09/13/2021  ? CO2 31 09/13/2021  ? GLUCOSE 85 09/13/2021  ? BUN 13 09/13/2021  ? CREATININE 0.69 09/13/2021  ? BILITOT 0.4 09/13/2021  ? ALKPHOS 92 04/12/2020  ? AST 16 09/13/2021  ? ALT 21 09/13/2021  ? PROT  7.5 09/13/2021  ? ALBUMIN 3.8 04/12/2020  ? CALCIUM 10.0 09/13/2021  ? ANIONGAP 13 04/12/2020  ? EGFR 104 09/13/2021  ? ?Lab Results  ?Component Value Date  ? CHOL 196 09/13/2021  ? ?Lab Results  ?Component Value Date  ? HDL 62 09/13/2021  ? ?Lab Results  ?Component Value Date  ? LDLCALC 108 (H) 09/13/2021  ? ?Lab Results  ?Component Value Date  ? TRIG 148 09/13/2021  ? ?Lab Results  ?Component Value Date  ? CHOLHDL 3.2 09/13/2021  ? ?Lab Results  ?Component Value Date  ? HGBA1C 5.7 (A) 09/13/2021  ? ? ?  ?Assessment & Plan:  ? ?Problem List Items Addressed This Visit   ? ?  ? Endocrine  ? IFG (impaired fasting glucose)  ?  Still working on cutting back on sugars has noticed direct correlation with some of her pain and her sugar intake.  I think that would be fantastic and will help with her glucose numbers as well as likely improve inflammation in her body in general. ?  ?  ?  ? Other  ? Encounter for chronic pain management - Primary  ?  I think adding Cymbalta would be fantastic.  Studies show about a 30% reduction in pain in equivalency to NSAIDs so this will hopefully significantly reduce her use of NSAIDs.  So I am really hopeful that this will be helpful we will start with 30 mg daily and then follow back up in about 4 to 6 weeks if she is tolerating that well we can always discuss increasing it if needed. ?  ?  ? Relevant Medications  ? DULoxetine (CYMBALTA) 30 MG capsule  ? ?Other Visit Diagnoses   ? ? Atypical chest pain      ? Relevant Orders  ? Exercise Tolerance Test  ? Cardiac Stress Test: Informed Consent Details: Physician/Practitioner Attestation; Transcribe to consent form and obtain patient signature  ? High platelet count      ? Relevant Orders  ? CBC with Differential/Platelet (Completed)  ? ?  ? ? ?Plan to recheck platelet numbers. ? ?Atypical chest pain-we will go ahead and refer for exercise treadmill stress test. ? ?Meds ordered this encounter  ?Medications  ? DULoxetine (CYMBALTA) 30 MG  capsule  ?  Sig: Take 1 capsule (30 mg total) by mouth daily.  ?  Dispense:  90 capsule  ?  Refill:  0  ? ? ?Follow-up: Return in about 6 weeks (around 11/29/2021) for New start medication.  ? ? ?Beatrice Lecher, MD ?

## 2021-10-18 NOTE — Assessment & Plan Note (Signed)
Still working on cutting back on sugars has noticed direct correlation with some of her pain and her sugar intake.  I think that would be fantastic and will help with her glucose numbers as well as likely improve inflammation in her body in general. ?

## 2021-10-18 NOTE — Assessment & Plan Note (Signed)
I think adding Cymbalta would be fantastic.  Studies show about a 30% reduction in pain in equivalency to NSAIDs so this will hopefully significantly reduce her use of NSAIDs.  So I am really hopeful that this will be helpful we will start with 30 mg daily and then follow back up in about 4 to 6 weeks if she is tolerating that well we can always discuss increasing it if needed. ?

## 2021-10-19 NOTE — Addendum Note (Signed)
Addended by: Deno Etienne on: 10/19/2021 09:55 AM ? ? Modules accepted: Orders ? ?

## 2021-10-22 NOTE — Progress Notes (Signed)
Hi Veeda, platelets did come down a little bit but still mildly elevated again I want a keep an eye on this and plan to recheck again in about 3 to 4 months.  Sometimes platelets can increase as there is some inflammation.

## 2021-10-25 ENCOUNTER — Other Ambulatory Visit: Payer: Self-pay

## 2021-10-25 ENCOUNTER — Ambulatory Visit (INDEPENDENT_AMBULATORY_CARE_PROVIDER_SITE_OTHER): Payer: Self-pay

## 2021-10-25 DIAGNOSIS — R0789 Other chest pain: Secondary | ICD-10-CM

## 2021-10-25 LAB — EXERCISE TOLERANCE TEST
Angina Index: 0
Duke Treadmill Score: 7
Estimated workload: 9.2
Exercise duration (min): 7 min
Exercise duration (sec): 28 s
MPHR: 167 {beats}/min
Peak HR: 151 {beats}/min
Percent HR: 90 %
RPE: 19
Rest BP: 72 mmHg
Rest HR: 72 {beats}/min
ST Depression (mm): 0 mm

## 2021-10-25 NOTE — Addendum Note (Signed)
Addended by: Beatrice Lecher D on: 10/25/2021 10:13 AM ? ? Modules accepted: Orders ? ?

## 2021-10-26 NOTE — Progress Notes (Signed)
Kelli Tyler, great news!  Your stress test is normal!  [name]!

## 2021-11-05 ENCOUNTER — Other Ambulatory Visit: Payer: Self-pay | Admitting: Family Medicine

## 2021-12-04 ENCOUNTER — Encounter: Payer: Self-pay | Admitting: Family Medicine

## 2021-12-04 ENCOUNTER — Ambulatory Visit (INDEPENDENT_AMBULATORY_CARE_PROVIDER_SITE_OTHER): Payer: Self-pay | Admitting: Family Medicine

## 2021-12-04 VITALS — BP 133/75 | HR 79 | Resp 18 | Ht 64.0 in | Wt 202.0 lb

## 2021-12-04 DIAGNOSIS — G8929 Other chronic pain: Secondary | ICD-10-CM

## 2021-12-04 DIAGNOSIS — R7989 Other specified abnormal findings of blood chemistry: Secondary | ICD-10-CM

## 2021-12-04 NOTE — Progress Notes (Signed)
? ?Established Patient Office Visit ? ?Subjective   ?Patient ID: Kelli Tyler, female    DOB: 1966-07-31  Age: 56 y.o. MRN: [id number] ? ?Chief Complaint  ?Patient presents with  ? Follow-up  ?  6 week follow up for Cymbalta. Patient states the Cymbalta is helping but she feels tired and unmotivated to do anything.   ? ? ?HPI ?6 week follow up for Cymbalta. Patient states the Cymbalta is helping but she feels tired and unmotivated to do anything.  She feels like if anything she is sleeping excessively.  She is also noticed her weight go up she says normally she is a morning person but just feels really tired and sleepy in the mornings.  She has been taking the Cymbalta in the morning she just has no energy and no enervation to get up and exercise.  But she does feel like she is been able to deal with things that are coming at her.  Much less stressed and worried so its been helpful in that way.  She feels like it may be helping a little bit with the pain in her hands but it has not really helped with her back pain she was really hoping that would reduce her back pain.  He did start taking magnesium at bedtime. ? ?She like to go ahead and recheck her platelets today.  They have been elevated the last couple of times she has had blood work. ? ? ? ?ROS ? ?  ?Objective:  ?  ? ?BP 133/75   Pulse 79   Resp 18   Ht 5\' 4"  (1.626 m)   Wt 202 lb (91.6 kg)   LMP 08/07/2015   SpO2 93%   BMI 34.67 kg/m?  ? ? ?Physical Exam ?Vitals and nursing note reviewed.  ?Constitutional:   ?   Appearance: She is well-developed.  ?HENT:  ?   Head: Normocephalic and atraumatic.  ?Cardiovascular:  ?   Rate and Rhythm: Normal rate and regular rhythm.  ?   Heart sounds: Normal heart sounds.  ?Pulmonary:  ?   Effort: Pulmonary effort is normal.  ?   Breath sounds: Normal breath sounds.  ?Skin: ?   General: Skin is warm and dry.  ?Neurological:  ?   Mental Status: She is alert and oriented to person, place, and time.  ?Psychiatric:     ?    Behavior: Behavior normal.  ? ? ? ?No results found for any visits on 12/04/21. ? ? ? ?The 10-year ASCVD risk score (Arnett DK, et al., 2019) is: 1.5% ? ?  ?Assessment & Plan:  ? ?Problem List Items Addressed This Visit   ? ?  ? Other  ? Encounter for chronic pain management  ?  The Cymbalta has had some positives but she is not sure that it really is improving her back pain and feels like she is sleeping a little excessively.  We discussed options including moving the Cymbalta to bedtime to see if it reduces sedation during the daytime and or maybe even increasing her dose as long as is not causing increased sedation to see if it helps more with the pain.  Or we discussed maybe switching the medication completely to something like amitriptyline or nortriptyline.  She said she like to move it to bedtime first for a week and then maybe try going up to 2 tabs and giving that a try for a week or 2.  If is not helpful at the side effects or  not improving then will consider switching to amitriptyline or nortriptyline.  She has like to the psychiatric benefit of feeling less stressed and less worried.  Need to monitor weight gain. ? ?  ?  ? ?Other Visit Diagnoses   ? ? High platelet count    -  Primary  ? Relevant Orders  ? CBC with Differential/Platelet  ? ?  ? ?High platelets-we will go ahead and recheck today she would like to do that while she is here.  The last couple times her platelets have just been slightly elevated.  Sometimes this can be seen as an acute phase reaction ? ?Return in about 3 months (around 03/05/2022) for Mood.  ? ? ?Beatrice Lecher, MD ? ?

## 2021-12-04 NOTE — Patient Instructions (Signed)
Try moving the cymbalta to bedtime for the next week and then increase to 2 tab nightly.  If not working or helping then let me know in 2-3 weeks.  ?

## 2021-12-04 NOTE — Assessment & Plan Note (Signed)
The Cymbalta has had some positives but she is not sure that it really is improving her back pain and feels like she is sleeping a little excessively.  We discussed options including moving the Cymbalta to bedtime to see if it reduces sedation during the daytime and or maybe even increasing her dose as long as is not causing increased sedation to see if it helps more with the pain.  Or we discussed maybe switching the medication completely to something like amitriptyline or nortriptyline.  She said she like to move it to bedtime first for a week and then maybe try going up to 2 tabs and giving that a try for a week or 2.  If is not helpful at the side effects or not improving then will consider switching to amitriptyline or nortriptyline.  She has like to the psychiatric benefit of feeling less stressed and less worried.  Need to monitor weight gain. ?

## 2021-12-05 LAB — CBC WITH DIFFERENTIAL/PLATELET
Absolute Monocytes: 927 cells/uL (ref 200–950)
Basophils Absolute: 85 cells/uL (ref 0–200)
Basophils Relative: 0.7 %
Eosinophils Absolute: 342 cells/uL (ref 15–500)
Eosinophils Relative: 2.8 %
HCT: 41.2 % (ref 35.0–45.0)
Hemoglobin: 13.4 g/dL (ref 11.7–15.5)
Lymphs Abs: 3538 cells/uL (ref 850–3900)
MCH: 27.9 pg (ref 27.0–33.0)
MCHC: 32.5 g/dL (ref 32.0–36.0)
MCV: 85.8 fL (ref 80.0–100.0)
MPV: 9.5 fL (ref 7.5–12.5)
Monocytes Relative: 7.6 %
Neutro Abs: 7308 cells/uL (ref 1500–7800)
Neutrophils Relative %: 59.9 %
Platelets: 411 10*3/uL — ABNORMAL HIGH (ref 140–400)
RBC: 4.8 10*6/uL (ref 3.80–5.10)
RDW: 13.6 % (ref 11.0–15.0)
Total Lymphocyte: 29 %
WBC: 12.2 10*3/uL — ABNORMAL HIGH (ref 3.8–10.8)

## 2021-12-05 NOTE — Progress Notes (Signed)
Hi Kelli Tyler, repeat platelet count is still just a little elevated.  But it is trending back to normal so it was 437, then 420, and now 411.  Platelets can increase when there is some inflammation in your body so that certainly could be contributing.  But it does seem to be gradually improving which is great.  So again I would recommend lets recheck it again this summer to see if it is trending back into the normal range for you.  In the meantime I would just work on a low inflammation diet.  Avoid a lot of nitrates and processed foods and more fresh vegetables and lean proteins and low on carbs.

## 2022-01-07 ENCOUNTER — Other Ambulatory Visit: Payer: Self-pay | Admitting: Family Medicine

## 2022-01-07 DIAGNOSIS — G8929 Other chronic pain: Secondary | ICD-10-CM

## 2022-02-05 ENCOUNTER — Other Ambulatory Visit: Payer: Self-pay | Admitting: Family Medicine

## 2022-03-14 ENCOUNTER — Ambulatory Visit (INDEPENDENT_AMBULATORY_CARE_PROVIDER_SITE_OTHER): Payer: Self-pay | Admitting: Family Medicine

## 2022-03-14 ENCOUNTER — Encounter: Payer: Self-pay | Admitting: Family Medicine

## 2022-03-14 VITALS — BP 136/75 | HR 72 | Ht 64.0 in | Wt 203.0 lb

## 2022-03-14 DIAGNOSIS — Z1211 Encounter for screening for malignant neoplasm of colon: Secondary | ICD-10-CM

## 2022-03-14 DIAGNOSIS — R7301 Impaired fasting glucose: Secondary | ICD-10-CM

## 2022-03-14 DIAGNOSIS — M79661 Pain in right lower leg: Secondary | ICD-10-CM

## 2022-03-14 DIAGNOSIS — M5441 Lumbago with sciatica, right side: Secondary | ICD-10-CM

## 2022-03-14 DIAGNOSIS — R7989 Other specified abnormal findings of blood chemistry: Secondary | ICD-10-CM

## 2022-03-14 DIAGNOSIS — G8929 Other chronic pain: Secondary | ICD-10-CM

## 2022-03-14 LAB — CBC WITH DIFFERENTIAL/PLATELET
Absolute Monocytes: 681 cells/uL (ref 200–950)
Basophils Absolute: 41 cells/uL (ref 0–200)
Basophils Relative: 0.5 %
Eosinophils Absolute: 221 cells/uL (ref 15–500)
Eosinophils Relative: 2.7 %
HCT: 39.2 % (ref 35.0–45.0)
Hemoglobin: 13.1 g/dL (ref 11.7–15.5)
Lymphs Abs: 2558 cells/uL (ref 850–3900)
MCH: 28.1 pg (ref 27.0–33.0)
MCHC: 33.4 g/dL (ref 32.0–36.0)
MCV: 83.9 fL (ref 80.0–100.0)
MPV: 9.5 fL (ref 7.5–12.5)
Monocytes Relative: 8.3 %
Neutro Abs: 4699 cells/uL (ref 1500–7800)
Neutrophils Relative %: 57.3 %
Platelets: 422 10*3/uL — ABNORMAL HIGH (ref 140–400)
RBC: 4.67 10*6/uL (ref 3.80–5.10)
RDW: 13.7 % (ref 11.0–15.0)
Total Lymphocyte: 31.2 %
WBC: 8.2 10*3/uL (ref 3.8–10.8)

## 2022-03-14 LAB — POCT GLYCOSYLATED HEMOGLOBIN (HGB A1C): Hemoglobin A1C: 5.3 % (ref 4.0–5.6)

## 2022-03-14 LAB — D-DIMER, QUANTITATIVE: D-Dimer, Quant: 0.74 mcg/mL FEU — ABNORMAL HIGH (ref <0.50)

## 2022-03-14 MED ORDER — DULOXETINE HCL 60 MG PO CPEP: 60.0000 mg | ORAL_CAPSULE | Freq: Every day | ORAL | 1 refills | Status: DC

## 2022-03-14 NOTE — Assessment & Plan Note (Signed)
A1C looks phenomenal today.  Down to 5.3 which is great she is really made some great strides in cutting sugar out of her diet she is even switch to black coffee and she has been trying to eat more vegetables.  Encouraged her to keep up the great work.  She is also trying to work on weight loss.

## 2022-03-14 NOTE — Assessment & Plan Note (Addendum)
Indication for chronic pain med: tramadol Medication and dose: 50mg  qd  # pills per month: 30mg  Last UDS date:  Opioid Treatment Agreement signed (Y/N): N NCCSRS reviewed this encounter (include red flags):   NO, PDMP not working  Additional medications: Cymbalta-we discussed options.  We will go ahead and increase Cymbalta to 60 mg.  We also discussed taking 2 extra strength Tylenol with her Cymbalta to see if that helps pain as well.  Also consider getting some additional treatment for her acute flare such as formal physical therapy etc.

## 2022-03-14 NOTE — Progress Notes (Signed)
Established Patient Office Visit  Subjective   Patient ID: Kelli Tyler, female    DOB: [birth year]  Age: 56 y.o. MRN: [id number]  Chief Complaint  Patient presents with   Pain Management    HPI  Follow-up chronic pain management.  Last time I saw her she was having a little bit of excess sedation and sleepiness on the Cymbalta so we discussed moving it to bedtime.  She also takes tramadol.  She was wanting to cut back on her NSAID use for her fibromyalgia.  She also said that her chronic low back pain has been flaring for about the last 2 weeks she has been taking ice baths to help.  No recent fall or injury to aggravate her symptoms it is just been more painful.  She would like to discuss adjusting her pain management.  She is not interested in PT at this time.  Impaired fasting glucose-no increased thirst or urination. No symptoms consistent with hypoglycemia.  Says she is really worked hard to cut back on sugar in her diet.  Overdue for 5-year repeat colonoscopy with digestive health specialists.  Unfortunately she is self-pay right now.  She also complains of sudden right calf pain that started last night.  She feels like it was in the upper calf area most behind the knee.  She said she finally took an aspirin because she could not get comfortable and says that did seem to help but she is very worried about the possibility of a DVT.  No prior history of DVT.  She    ROS    Objective:     BP 136/75   Pulse 72   Ht 5\' 4"  (1.626 m)   Wt 203 lb (92.1 kg)   LMP 08/07/2015   SpO2 94%   BMI 34.84 kg/m    Physical Exam Vitals and nursing note reviewed.  Constitutional:      Appearance: She is well-developed.  HENT:     Head: Normocephalic and atraumatic.  Cardiovascular:     Rate and Rhythm: Normal rate and regular rhythm.     Heart sounds: Normal heart sounds.  Pulmonary:     Effort: Pulmonary effort is normal.     Breath sounds: Normal breath sounds.  Skin:     General: Skin is warm and dry.  Neurological:     Mental Status: She is alert and oriented to person, place, and time.  Psychiatric:        Behavior: Behavior normal.      Results for orders placed or performed in visit on 03/14/22  D-dimer, quantitative  Result Value Ref Range   D-Dimer, Quant 0.74 (H) <0.50 mcg/mL FEU  CBC with Differential/Platelet  Result Value Ref Range   WBC 8.2 3.8 - 10.8 Thousand/uL   RBC 4.67 3.80 - 5.10 Million/uL   Hemoglobin 13.1 11.7 - 15.5 g/dL   HCT 03/16/22 85.4 - 62.7 %   MCV 83.9 80.0 - 100.0 fL   MCH 28.1 27.0 - 33.0 pg   MCHC 33.4 32.0 - 36.0 g/dL   RDW 03.5 00.9 - 38.1 %   Platelets 422 (H) 140 - 400 Thousand/uL   MPV 9.5 7.5 - 12.5 fL   Neutro Abs 4,699 1,500 - 7,800 cells/uL   Lymphs Abs 2,558 850 - 3,900 cells/uL   Absolute Monocytes 681 200 - 950 cells/uL   Eosinophils Absolute 221 15 - 500 cells/uL   Basophils Absolute 41 0 - 200 cells/uL   Neutrophils Relative %  57.3 %   Total Lymphocyte 31.2 %   Monocytes Relative 8.3 %   Eosinophils Relative 2.7 %   Basophils Relative 0.5 %  POCT glycosylated hemoglobin (Hb A1C)  Result Value Ref Range   Hemoglobin A1C 5.3 4.0 - 5.6 %   HbA1c POC (<> result, manual entry)     HbA1c, POC (prediabetic range)     HbA1c, POC (controlled diabetic range)        The 10-year ASCVD risk score (Arnett DK, et al., 2019) is: 1.6%    Assessment & Plan:   Problem List Items Addressed This Visit       Endocrine   IFG (impaired fasting glucose)    A1C looks phenomenal today.  Down to 5.3 which is great she is really made some great strides in cutting sugar out of her diet she is even switch to black coffee and she has been trying to eat more vegetables.  Encouraged her to keep up the great work.  She is also trying to work on weight loss.      Relevant Orders   POCT glycosylated hemoglobin (Hb A1C) (Completed)     Nervous and Auditory   Chronic bilateral low back pain with right-sided sciatica    Relevant Medications   DULoxetine (CYMBALTA) 60 MG capsule     Other   Encounter for chronic pain management - Primary    Indication for chronic pain med: tramadol Medication and dose: 50mg  qd  # pills per month: 30mg  Last UDS date:  Opioid Treatment Agreement signed (Y/N): N [facility] reviewed this encounter (include red flags):   NO, PDMP not working  Additional medications: Cymbalta-we discussed options.  We will go ahead and increase Cymbalta to 60 mg.  We also discussed taking 2 extra strength Tylenol with her Cymbalta to see if that helps pain as well.  Also consider getting some additional treatment for her acute flare such as formal physical therapy etc.      Relevant Medications   DULoxetine (CYMBALTA) 60 MG capsule   Other Relevant Orders   DRUG MONITORING, PANEL 6 WITH CONFIRMATION, URINE   Other Visit Diagnoses     Colon cancer screening       Right calf pain       Relevant Orders   D-dimer, quantitative (Completed)   High platelet count       Relevant Orders   CBC with Differential/Platelet (Completed)      Right calf pain-she is very worried about possibility of a DVT.  No prior history.  No specific triggers so we discussed maybe just getting a stat D-dimer and calling with the results once available.  She did get some improvement in her pain after taking an aspirin last night.  Elevated platelet count-plan to recheck again today.    Return in about 6 months (around 09/14/2022) for chronic pain management .    , MD

## 2022-03-14 NOTE — Progress Notes (Signed)
Hi Kelli Tyler, your d-dimer was elevated but this looks like it is normal for you based on old one.  So I don't think you have a blood clot.  We will keep an eye on it and use some heat gentle stretches and if you feel like its not getting better over the next week then please let me know.  Blood count is normal, except the platelets which are still high but they are stable and they are not trending up.  So I am not concerned at this point.  A1c looks fantastic!

## 2022-03-15 LAB — DRUG MONITORING, PANEL 6 WITH CONFIRMATION, URINE
6 Acetylmorphine: NEGATIVE ng/mL (ref <10)
Alcohol Metabolites: NEGATIVE ng/mL (ref <500)
Amphetamines: NEGATIVE ng/mL (ref <500)
Barbiturates: NEGATIVE ng/mL (ref <300)
Benzodiazepines: NEGATIVE ng/mL (ref <100)
Cocaine Metabolite: NEGATIVE ng/mL (ref <150)
Creatinine: 97 mg/dL (ref >=20.0)
Marijuana Metabolite: NEGATIVE ng/mL (ref <20)
Methadone Metabolite: NEGATIVE ng/mL (ref <100)
Opiates: NEGATIVE ng/mL (ref <100)
Oxidant: NEGATIVE ug/mL (ref <200)
Oxycodone: NEGATIVE ng/mL (ref <100)
Phencyclidine: NEGATIVE ng/mL (ref <25)
pH: 6.4 (ref 4.5–9.0)

## 2022-03-15 LAB — DM TEMPLATE

## 2022-03-19 ENCOUNTER — Telehealth: Payer: Self-pay

## 2022-03-19 NOTE — Telephone Encounter (Signed)
Left message advising of recommendations.  

## 2022-03-19 NOTE — Telephone Encounter (Signed)
That is in the normal range.  Also renal function in January was normal, I do not think there is any need to investigate.

## 2022-03-19 NOTE — Telephone Encounter (Signed)
Kelli Tyler called and worried because her Creatinine is 97.0 on her drug monitoring results. She states it is more than it has been in the past.

## 2022-04-09 ENCOUNTER — Other Ambulatory Visit: Payer: Self-pay | Admitting: Family Medicine

## 2022-05-09 ENCOUNTER — Other Ambulatory Visit: Payer: Self-pay | Admitting: Family Medicine

## 2022-07-30 ENCOUNTER — Other Ambulatory Visit: Payer: Self-pay | Admitting: Family Medicine

## 2022-08-06 ENCOUNTER — Telehealth: Payer: Self-pay | Admitting: Family Medicine

## 2022-08-06 ENCOUNTER — Encounter: Payer: Self-pay | Admitting: Family Medicine

## 2022-08-06 ENCOUNTER — Ambulatory Visit (INDEPENDENT_AMBULATORY_CARE_PROVIDER_SITE_OTHER): Payer: BC Managed Care – PPO | Admitting: Family Medicine

## 2022-08-06 VITALS — BP 127/78 | HR 97 | Ht 64.0 in | Wt 197.0 lb

## 2022-08-06 DIAGNOSIS — R7301 Impaired fasting glucose: Secondary | ICD-10-CM

## 2022-08-06 DIAGNOSIS — R7989 Other specified abnormal findings of blood chemistry: Secondary | ICD-10-CM

## 2022-08-06 DIAGNOSIS — Z1211 Encounter for screening for malignant neoplasm of colon: Secondary | ICD-10-CM | POA: Diagnosis not present

## 2022-08-06 DIAGNOSIS — Z7689 Persons encountering health services in other specified circumstances: Secondary | ICD-10-CM

## 2022-08-06 DIAGNOSIS — Z1231 Encounter for screening mammogram for malignant neoplasm of breast: Secondary | ICD-10-CM

## 2022-08-06 DIAGNOSIS — E7800 Pure hypercholesterolemia, unspecified: Secondary | ICD-10-CM

## 2022-08-06 DIAGNOSIS — E785 Hyperlipidemia, unspecified: Secondary | ICD-10-CM | POA: Insufficient documentation

## 2022-08-06 NOTE — Telephone Encounter (Signed)
Did order lab including checking her platelet if she would like to come for lab draw in Jan.

## 2022-08-06 NOTE — Assessment & Plan Note (Signed)
Discussed options.  You can check with your insurance to see if they will cover zepbound, Wegovy, Saxenda, Qsymia, etc.   Visit #:1 Starting Weight: 197 lbs   Current weight: 197 lbs  Previous weight: Change in weight: Goal weight: Dietary goals: Exercise goals: Medication: Follow-up and referrals:4-6 weeks after starting medication.

## 2022-08-06 NOTE — Assessment & Plan Note (Signed)
Due to recheck A1C  

## 2022-08-06 NOTE — Patient Instructions (Signed)
You can check with your insurance to see if they will cover zepbound, Wegovy, Saxenda, Qsymia, etc.

## 2022-08-06 NOTE — Progress Notes (Signed)
Established Patient Office Visit  Subjective   Patient ID: Kelli Tyler, female    DOB: [birth year]  Age: 56 y.o. MRN: [id number]  Chief Complaint  Patient presents with   Follow-up    HPI  She has new insurance now and so would like to get up-to-date on some of her screenings including mammogram and colon cancer screening.  Last colonoscopy was in 2015.  She declines flu and COVID-vaccine but is interested in possibly the shingles vaccine.  She also wanted to know if we should recheck her platelets which have been low in the past.  He also wants to discuss weight management and possible medication treatments.  She also fell recently and fractured her right arm and she has been going to physical therapy and is getting better.  She has been doing her exercises 3 times a day.    ROS    Objective:     BP 127/78   Pulse 97   Ht 5\' 4"  (1.626 m)   Wt 197 lb (89.4 kg)   LMP 08/07/2015   SpO2 97%   BMI 33.81 kg/m    Physical Exam Vitals reviewed.  Constitutional:      Appearance: She is well-developed.  HENT:     Head: Normocephalic and atraumatic.  Eyes:     Conjunctiva/sclera: Conjunctivae normal.  Cardiovascular:     Rate and Rhythm: Normal rate.  Pulmonary:     Effort: Pulmonary effort is normal.  Skin:    General: Skin is dry.     Coloration: Skin is not pale.  Neurological:     Mental Status: She is alert and oriented to person, place, and time.  Psychiatric:        Behavior: Behavior normal.      No results found for any visits on 08/06/22.    The 10-year ASCVD risk score (Arnett DK, et al., 2019) is: 1.5%    Assessment & Plan:   Problem List Items Addressed This Visit       Endocrine   IFG (impaired fasting glucose)    Due to recheck A1C      Relevant Orders   CBC   Hemoglobin A1c   COMPLETE METABOLIC PANEL WITH GFR   Lipid Panel w/reflex Direct LDL     Other   Hyperlipidemia    Due to recheck lipids.       Relevant Orders    CBC   Hemoglobin A1c   COMPLETE METABOLIC PANEL WITH GFR   Lipid Panel w/reflex Direct LDL   Encounter for weight management    Discussed options.  You can check with your insurance to see if they will cover zepbound, Wegovy, Saxenda, Qsymia, etc.   Visit #:1 Starting Weight: 197 lbs   Current weight: 197 lbs  Previous weight: Change in weight: Goal weight: Dietary goals: Exercise goals: Medication: Follow-up and referrals:4-6 weeks after starting medication.        Elevated LDL cholesterol level   Relevant Orders   Lipid Panel w/reflex Direct LDL   Other Visit Diagnoses     Screening mammogram for breast cancer    -  Primary   Relevant Orders   MM Digital Screening   Screen for colon cancer       Relevant Orders   Ambulatory referral to Gastroenterology   High platelet count       Relevant Orders   CBC   Hemoglobin A1c   COMPLETE METABOLIC PANEL WITH GFR   Lipid Panel w/reflex  Direct LDL       No follow-ups on file.    Nani Gasser, MD

## 2022-08-06 NOTE — Assessment & Plan Note (Signed)
Due to recheck lipids. 

## 2022-08-07 ENCOUNTER — Encounter: Payer: Self-pay | Admitting: Family Medicine

## 2022-08-07 DIAGNOSIS — G8929 Other chronic pain: Secondary | ICD-10-CM

## 2022-08-07 NOTE — Telephone Encounter (Signed)
Sent patient a Mychart message regarding lab order.

## 2022-08-29 MED ORDER — DULOXETINE HCL 60 MG PO CPEP: 60.0000 mg | ORAL_CAPSULE | Freq: Every day | ORAL | 1 refills | Status: DC

## 2022-09-11 ENCOUNTER — Ambulatory Visit (INDEPENDENT_AMBULATORY_CARE_PROVIDER_SITE_OTHER): Payer: BC Managed Care – PPO

## 2022-09-11 DIAGNOSIS — Z1231 Encounter for screening mammogram for malignant neoplasm of breast: Secondary | ICD-10-CM

## 2022-09-12 LAB — COMPLETE METABOLIC PANEL WITH GFR
AG Ratio: 1.3 (calc) (ref 1.0–2.5)
ALT: 23 U/L (ref 6–29)
AST: 21 U/L (ref 10–35)
Albumin: 4.5 g/dL (ref 3.6–5.1)
Alkaline phosphatase (APISO): 165 U/L — ABNORMAL HIGH (ref 37–153)
BUN: 11 mg/dL (ref 7–25)
CO2: 30 mmol/L (ref 20–32)
Calcium: 10.3 mg/dL (ref 8.6–10.4)
Chloride: 99 mmol/L (ref 98–110)
Creat: 0.7 mg/dL (ref 0.50–1.03)
Globulin: 3.6 g/dL (calc) (ref 1.9–3.7)
Glucose, Bld: 88 mg/dL (ref 65–99)
Potassium: 4.9 mmol/L (ref 3.5–5.3)
Sodium: 138 mmol/L (ref 135–146)
Total Bilirubin: 0.3 mg/dL (ref 0.2–1.2)
Total Protein: 8.1 g/dL (ref 6.1–8.1)
eGFR: 103 mL/min/{1.73_m2} (ref >=60)

## 2022-09-12 LAB — LIPID PANEL W/REFLEX DIRECT LDL
Cholesterol: 244 mg/dL — ABNORMAL HIGH (ref <200)
HDL: 74 mg/dL (ref >=50)
LDL Cholesterol (Calc): 140 mg/dL (calc) — ABNORMAL HIGH
Non-HDL Cholesterol (Calc): 170 mg/dL (calc) — ABNORMAL HIGH (ref <130)
Total CHOL/HDL Ratio: 3.3 (calc) (ref <5.0)
Triglycerides: 161 mg/dL — ABNORMAL HIGH (ref <150)

## 2022-09-12 LAB — CBC
HCT: 42.8 % (ref 35.0–45.0)
Hemoglobin: 13.8 g/dL (ref 11.7–15.5)
MCH: 27.2 pg (ref 27.0–33.0)
MCHC: 32.2 g/dL (ref 32.0–36.0)
MCV: 84.4 fL (ref 80.0–100.0)
MPV: 9.4 fL (ref 7.5–12.5)
Platelets: 471 10*3/uL — ABNORMAL HIGH (ref 140–400)
RBC: 5.07 10*6/uL (ref 3.80–5.10)
RDW: 13.2 % (ref 11.0–15.0)
WBC: 8.1 10*3/uL (ref 3.8–10.8)

## 2022-09-12 LAB — HEMOGLOBIN A1C
Hgb A1c MFr Bld: 5.8 % of total Hgb — ABNORMAL HIGH (ref <5.7)
Mean Plasma Glucose: 120 mg/dL
eAG (mmol/L): 6.6 mmol/L

## 2022-09-13 NOTE — Progress Notes (Signed)
Please call patient. Normal mammogram.  Repeat in 1 year.  

## 2022-09-13 NOTE — Progress Notes (Signed)
Kelli Tyler, platelets are still slightly elevated but they have been over the last year and they do look stable.  Your A1c is still at 5.8 in the prediabetes range it is up a little bit compared to 6 months ago so just continue to work on healthy diet and regular exercise.  Kidney function is stable.  Liver enzymes are normal but her alkaline phosphatase is a little elevated.  I would like to recheck that in about a month.  Your cholesterol is way up compared to last year.  Really encouraged her to work on healthy food choices and regular exercise to try to bring that back down.

## 2022-09-13 NOTE — Progress Notes (Signed)
Do not usually do a CAT scan for elevated platelets.  Its usually from inflammation in the body.  As far as the cancer screening we just need to get everything up-to-date.  I think she is getting her colonoscopy updated soon I know she got her mammogram done.  So we just need to make sure that were on top of those typical screenings.  Counts has jumped out as far as a particular organ that could be causing an issue so I think that is actually very reassuring.

## 2022-09-17 ENCOUNTER — Other Ambulatory Visit: Payer: Self-pay | Admitting: *Deleted

## 2022-09-17 DIAGNOSIS — R748 Abnormal levels of other serum enzymes: Secondary | ICD-10-CM

## 2022-09-19 ENCOUNTER — Ambulatory Visit: Payer: Self-pay | Admitting: Family Medicine

## 2022-09-24 LAB — HM COLONOSCOPY

## 2022-10-08 ENCOUNTER — Other Ambulatory Visit: Payer: Self-pay | Admitting: Family Medicine

## 2022-10-08 NOTE — Telephone Encounter (Signed)
Meds ordered this encounter  Medications   traMADol (ULTRAM) 50 MG tablet    Sig: Take 1 tablet (50 mg total) by mouth 2 (two) times daily as needed. TAKE 1 TABLET BY MOUTH 3 TIMES A DAY AS NEEDED FOR PAIN    Dispense:  30 tablet    Refill:  1    This request is for a new prescription for a controlled substance as required by Federal/State law.

## 2022-10-08 NOTE — Telephone Encounter (Signed)
Message sent to patient via Mychart.

## 2022-10-08 NOTE — Telephone Encounter (Signed)
Last OV: 08/06/22 Next OV: none scheduled Last RF: 07/30/22

## 2022-10-09 ENCOUNTER — Telehealth: Payer: Self-pay

## 2022-10-09 NOTE — Telephone Encounter (Addendum)
Initiated Prior authorization [id number] HCl '50MG'$  tablets Via: Covermymeds Case/Key:[id number] Status: approved  as of 10/09/22 Reason:Authorization Expiration Date: 04/07/2023  Notified Pt via: Mychart

## 2022-10-18 ENCOUNTER — Encounter: Payer: Self-pay | Admitting: Family Medicine

## 2022-10-18 LAB — ALKALINE PHOSPHATASE: Alkaline phosphatase (APISO): 134 U/L (ref 37–153)

## 2022-10-18 NOTE — Progress Notes (Signed)
Kelli Tyler, alkaline phosphatase went back to normal which is great.  We are getting a reminder that you are due for repeat colonoscopy.  Please let us know if you have already had it done or if you have it scheduled.

## 2022-10-24 ENCOUNTER — Ambulatory Visit: Payer: BC Managed Care – PPO | Admitting: Sports Medicine

## 2022-10-24 ENCOUNTER — Other Ambulatory Visit: Payer: Self-pay

## 2022-10-24 ENCOUNTER — Ambulatory Visit (INDEPENDENT_AMBULATORY_CARE_PROVIDER_SITE_OTHER): Payer: BC Managed Care – PPO

## 2022-10-24 DIAGNOSIS — M5441 Lumbago with sciatica, right side: Secondary | ICD-10-CM

## 2022-10-24 DIAGNOSIS — M5030 Other cervical disc degeneration, unspecified cervical region: Secondary | ICD-10-CM

## 2022-10-24 DIAGNOSIS — G8929 Other chronic pain: Secondary | ICD-10-CM

## 2022-10-24 MED ORDER — NAPROXEN 500 MG PO TABS: 500.0000 mg | ORAL_TABLET | Freq: Two times a day (BID) | ORAL | 3 refills | Status: DC

## 2022-10-24 MED ORDER — PREDNISONE 50 MG PO TABS: ORAL_TABLET | ORAL | 0 refills | Status: DC

## 2022-10-24 NOTE — Assessment & Plan Note (Signed)
As below chronic low back pain, nothing overtly radicular. Pain is axial and discogenic. She did have x-rays that showed facet arthritis and L4-L5 spondylolisthesis. Explained the anatomy, evolutionary anthropology of disc disease, treatment protocol. Adding x-rays, 5 days of prednisone followed by naproxen 500 twice daily, formal physical therapy, return to see me in 6 weeks.

## 2022-10-24 NOTE — Progress Notes (Signed)
    Procedures performed today:    None.  Independent interpretation of notes and tests performed by another provider:   None.  Brief History, Exam, Impression, and Recommendations:    DDD (degenerative disc disease), cervical Chronic neck pain, periscapular with radiation down the left arm. Suspect cervical DDD, exam is unrevealing, she is somewhat hyporeflexic on the right but if she is status post ORIF of her humeral fracture. Explained the anatomy, evolutionary anthropology of disc disease, treatment protocol. Adding x-rays, 5 days of prednisone followed by naproxen 500 twice daily, formal physical therapy, return to see me in 6 weeks.  Chronic bilateral low back pain with right-sided sciatica As below chronic low back pain, nothing overtly radicular. Pain is axial and discogenic. She did have x-rays that showed facet arthritis and L4-L5 spondylolisthesis. Explained the anatomy, evolutionary anthropology of disc disease, treatment protocol. Adding x-rays, 5 days of prednisone followed by naproxen 500 twice daily, formal physical therapy, return to see me in 6 weeks.    ____________________________________________ Gwen Her. Dianah Field, M.D., ABFM., CAQSM., AME. Primary Care and Sports Medicine  [facility]  Adjunct Professor of [place] [employer]  Risk manager

## 2022-10-24 NOTE — Assessment & Plan Note (Signed)
Chronic neck pain, periscapular with radiation down the left arm. Suspect cervical DDD, exam is unrevealing, she is somewhat hyporeflexic on the right but if she is status post ORIF of her humeral fracture. Explained the anatomy, evolutionary anthropology of disc disease, treatment protocol. Adding x-rays, 5 days of prednisone followed by naproxen 500 twice daily, formal physical therapy, return to see me in 6 weeks.

## 2022-11-06 NOTE — Therapy (Signed)
OUTPATIENT PHYSICAL THERAPY CERVICAL AND LUMBAR EVALUATION   Patient Name: Kelli Tyler MRN: [id number] DOB:01-27-1966, 57 y.o., female Today's Date: 11/07/2022  END OF SESSION:  PT End of Session - 11/07/22 1013     Visit Number 1    Date for PT Re-Evaluation 01/02/23    PT Start Time 0935    PT Stop Time 1013    PT Time Calculation (min) 38 min    Activity Tolerance Patient tolerated treatment well    Behavior During Therapy [facility] for tasks assessed/performed             Past Medical History:  Diagnosis Date   ADHD    Back pain    Restless leg    Past Surgical History:  Procedure Laterality Date   ABLATION     CHOLECYSTECTOMY     NASAL FRACTURE SURGERY     TUBAL LIGATION     Patient Active Problem List   Diagnosis Date Noted   DDD (degenerative disc disease), cervical 10/24/2022   Hyperlipidemia 08/06/2022   Encounter for chronic pain management 09/13/2021   Encounter for weight management 12/22/2020   Former smoker 03/07/2020   Chronic bilateral low back pain with right-sided sciatica 03/07/2020   IFG (impaired fasting glucose) 03/07/2020   History of depression 11/12/2019   Hot flashes 11/12/2019   Elevated LDL cholesterol level 11/17/2018   Chronic upper back pain 03/14/2014   Attention deficit hyperactivity disorder (ADHD) 03/30/2009   BREAST MASS, RIGHT 03/27/2009   RESTLESS LEG SYNDROME 12/14/2007   Other specified disease of hair and hair follicles [id number]   GERD, SEVERE 01/27/2007   DERMATITIS, OTHER ATOPIC 01/27/2007   INSOMNIA 01/27/2007    PCP: Hali Marry, MD   REFERRING PROVIDER: Silverio Decamp,*   REFERRING DIAG: M50.30 (ICD-10-CM) - DDD (degenerative disc disease), cervical  And lumbar DDD  THERAPY DIAG:  Pain in thoracic spine  Other low back pain  Stiffness of cervical spine  Muscle weakness (generalized)  Rationale for Evaluation and Treatment: Rehabilitation  ONSET DATE: Feb 2024  SUBJECTIVE:                                                                                                                                                                                                          SUBJECTIVE STATEMENT: My back has been hurting for a long time. After breaking arm and returning to work her mid back started hurting more. Also has h/o of LBP which limits her from sitting on couch. She reports numbness in her left hand with driving -  whole hand. I pop my neck a lot, it feels good. Patient denies neck pain. Hand dominance: Right  PERTINENT HISTORY:  07/04/22 ORIF R humerus, LBP, DDD neck and back  PAIN:  Are you having pain? Yes: NPRS scale: 8-9/10 Pain location: mid back Pain description: stabbing Aggravating factors: working, carries plates on left Relieving factors: Lying down and stretching  Are you having pain? Yes: NPRS scale: 8-9/10 Pain location: low back Pain description: cramp/spasm Aggravating factors: standing rolling silverwear Relieving factors: stretches  PRECAUTIONS: None  WEIGHT BEARING RESTRICTIONS: No  FALLS:  Has patient fallen in last 6 months? Yes. Number of falls 1  LIVING ENVIRONMENT: Lives with: lives with their spouse Lives in: House/apartment Stairs: No Has following equipment at home: None  OCCUPATION: waitress at [employer]  PLOF: [place]: want my back to stop hurting  NEXT MD VISIT: 6 weeks  OBJECTIVE:   DIAGNOSTIC FINDINGS:  Mild degenerative changes in cervical 2024 and thoracic spine 2020.  Lumbar 2020: Progression in degenerative change in the facet joints with new anterolisthesis of L4 on L5 and L5 on S1.  PATIENT SURVEYS:  FOTO Thoracic 46 (predicted 57)  COGNITION: Overall cognitive status: Within functional limits for tasks assessed  SENSATION: WFL  POSTURE: No Significant postural limitations  PALPATION: TTP in gluteals   CERVICAL ROM:   Active ROM A/PROM (deg) eval  Flexion full   Extension full  Right lateral flexion 75%  Left lateral flexion 75%  Right rotation 75%  Left rotation 75%   (Blank rows = not tested)   Right side tighter  LUMBAR ROM: WNL  Left SB has decreased spinal curve vs. R  UPPER EXTREMITY ROM: WFL  UPPER EXTREMITY MMT: R shoulder flex, ABD 4+/5, else 5/5. L shoulder/elbow 5/5. R biceps 4+/5, triceps 5/5. Mid Traps R 3+ L 4+ Low Traps R 4- L 3+ Rhomboids R 4- L 4  LOWER EXTREMITY ROM: WFL  LOWER EXTREMITY MMT: Grossly 5/5 except bil hip flex 4+/5 and L hip ABD 4-/5  MUSCLE LENGTH: Tight R quads, piriformis; bil HS, UT, LS  SPINAL MOBILITY: decreased PA mobility in mid thoracic spine.  TODAY'S TREATMENT:                                                                                                                              DATE:   11/06/22 See pt ed  PATIENT EDUCATION:  Education details: PT eval findings, anticipated POC, and initial HEP  Person educated: Patient Education method: Explanation, Demonstration, Verbal cues, and Handouts Education comprehension: verbalized understanding and returned demonstration  HOME EXERCISE PROGRAM: Access Code: [email or web address] URL: [email or web address] Date: 11/07/2022 Prepared by: Almyra Free  Exercises - Supine Hamstring Stretch with Strap  - 2 x daily - 7 x weekly - 2 sets - 3 reps - 30 sec hold - Sidelying Thoracic Rotation with Open Book  - 1 x daily - 7 x weekly - 2 sets - 5 reps -  Thoracic Extension Mobilization with Noodle  - 1 x daily - 7 x weekly - 1 sets - 3 reps - 20 sec hold - Seated Cervical Sidebending Stretch  - 2 x daily - 7 x weekly - 1 sets - 3 reps - 30-60 sec hold - Seated Levator Scapulae Stretch  - 2 x daily - 7 x weekly - 1 sets - 3 reps - 30 sec hold  ASSESSMENT:  CLINICAL IMPRESSION: Patient is a 57 y.o. female who was seen today for physical therapy evaluation and treatment for thoracic and low back pain and neck stiffness. She fell in November 2023 and  fractured her R humerus and had subsequent ORIF. 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 She demonstrates decreased upper back and right UE strength and flexibility deficits in her cervical and thoracic spines as well as BLE. Pain is affecting her ability to do her job and also prevents her from sitting at home. She will benefit from skilled PT to address these deficits.    OBJECTIVE IMPAIRMENTS: decreased activity tolerance, decreased ROM, decreased strength, hypomobility, increased muscle spasms, impaired flexibility, improper body mechanics, and pain.   ACTIVITY LIMITATIONS: carrying, lifting, bending, and sitting  PARTICIPATION LIMITATIONS: occupation  PERSONAL FACTORS: Past/current experiences and 1-2 comorbidities: recent ORIF R humerus and DDD  are also affecting patient's functional outcome.   REHAB POTENTIAL: Good  CLINICAL DECISION MAKING: Stable/uncomplicated  EVALUATION COMPLEXITY: Low   GOALS: Goals reviewed with patient? Yes  SHORT TERM GOALS: Target date: 12/05/2022   Patient will be independent with initial HEP.  Baseline:  Goal status: INITIAL  2.  Patient will report improvement in thoracic spine pain by 50% to improve ability to do job tasks.  Baseline:  Goal status: INITIAL   LONG TERM GOALS: Target date: 01/02/2023   Patient will be independent with advanced/ongoing HEP to improve outcomes and carryover.  Baseline:  Goal status: INITIAL  2.  Patient will report 85% improvement in thoracic and low back pain to improve QOL.  Baseline:  Goal status: INITIAL  3.  Patient will demonstrate functional pain free cervical and  lumbar ROM to perform ADLs.   Baseline:  Goal status: INITIAL  4.  Patient will demonstrate improved mid back strength to 4+/5 to improve ability to carry her trays and perform work tasks. Baseline:  Goal status: INITIAL  5.  Patient will report 67 on thoracic FOTO to demonstrate  improved functional ability.  Baseline: 46 Goal status: INITIAL      PLAN:  PT FREQUENCY: 1-2x/week  PT DURATION: 8 weeks  PLANNED INTERVENTIONS: Therapeutic exercises, Therapeutic activity, Neuromuscular re-education, Patient/Family education, Self Care, Joint mobilization, Dry Needling, Electrical stimulation, Spinal mobilization, Cryotherapy, Moist heat, Taping, Traction, Ultrasound, and Manual therapy  PLAN FOR NEXT SESSION: Focus on mid/low traps, rhomboids strength, spinal stab, body mechanics for job specific tasks, Review and progress HEP for flexibility also.   Kodey Xue, PT 11/07/2022, 12:43 PM

## 2022-11-07 ENCOUNTER — Ambulatory Visit: Payer: BC Managed Care – PPO | Attending: Sports Medicine | Admitting: Physical Therapy

## 2022-11-07 ENCOUNTER — Encounter: Payer: Self-pay | Admitting: Physical Therapy

## 2022-11-07 ENCOUNTER — Other Ambulatory Visit: Payer: Self-pay

## 2022-11-07 DIAGNOSIS — M436 Torticollis: Secondary | ICD-10-CM | POA: Diagnosis not present

## 2022-11-07 DIAGNOSIS — M5030 Other cervical disc degeneration, unspecified cervical region: Secondary | ICD-10-CM | POA: Diagnosis not present

## 2022-11-07 DIAGNOSIS — M546 Pain in thoracic spine: Secondary | ICD-10-CM | POA: Diagnosis not present

## 2022-11-07 DIAGNOSIS — M5459 Other low back pain: Secondary | ICD-10-CM | POA: Diagnosis not present

## 2022-11-07 DIAGNOSIS — M6281 Muscle weakness (generalized): Secondary | ICD-10-CM

## 2022-11-14 ENCOUNTER — Ambulatory Visit: Payer: BC Managed Care – PPO | Admitting: Physical Therapy

## 2022-11-14 ENCOUNTER — Encounter: Payer: Self-pay | Admitting: Physical Therapy

## 2022-11-14 DIAGNOSIS — M6281 Muscle weakness (generalized): Secondary | ICD-10-CM

## 2022-11-14 DIAGNOSIS — M5459 Other low back pain: Secondary | ICD-10-CM

## 2022-11-14 DIAGNOSIS — M5030 Other cervical disc degeneration, unspecified cervical region: Secondary | ICD-10-CM | POA: Diagnosis not present

## 2022-11-14 DIAGNOSIS — M436 Torticollis: Secondary | ICD-10-CM

## 2022-11-14 DIAGNOSIS — M546 Pain in thoracic spine: Secondary | ICD-10-CM

## 2022-11-14 NOTE — Therapy (Signed)
OUTPATIENT PHYSICAL THERAPY CERVICAL AND LUMBAR TREATMENT   Patient Name: Kelli Tyler MRN: [id number] DOB:April 22, 1966, 57 y.o., female Today's Date: 11/14/2022  END OF SESSION:  PT End of Session - 11/14/22 0858     Visit Number 2    Date for PT Re-Evaluation 01/02/23    PT Start Time 0848    PT Stop Time 0927    PT Time Calculation (min) 39 min    Activity Tolerance Patient tolerated treatment well    Behavior During Therapy [facility] for tasks assessed/performed              Past Medical History:  Diagnosis Date   ADHD    Back pain    Restless leg    Past Surgical History:  Procedure Laterality Date   ABLATION     CHOLECYSTECTOMY     NASAL FRACTURE SURGERY     TUBAL LIGATION     Patient Active Problem List   Diagnosis Date Noted   DDD (degenerative disc disease), cervical 10/24/2022   Hyperlipidemia 08/06/2022   Encounter for chronic pain management 09/13/2021   Encounter for weight management 12/22/2020   Former smoker 03/07/2020   Chronic bilateral low back pain with right-sided sciatica 03/07/2020   IFG (impaired fasting glucose) 03/07/2020   History of depression 11/12/2019   Hot flashes 11/12/2019   Elevated LDL cholesterol level 11/17/2018   Chronic upper back pain 03/14/2014   Attention deficit hyperactivity disorder (ADHD) 03/30/2009   BREAST MASS, RIGHT 03/27/2009   RESTLESS LEG SYNDROME 12/14/2007   Other specified disease of hair and hair follicles [id number]   GERD, SEVERE 01/27/2007   DERMATITIS, OTHER ATOPIC 01/27/2007   INSOMNIA 01/27/2007    PCP: Hali Marry, MD   REFERRING PROVIDER: Silverio Decamp,*   REFERRING DIAG: M50.30 (ICD-10-CM) - DDD (degenerative disc disease), cervical  And lumbar DDD  THERAPY DIAG:  Pain in thoracic spine  Other low back pain  Stiffness of cervical spine  Muscle weakness (generalized)  Rationale for Evaluation and Treatment: Rehabilitation  ONSET DATE: Feb 2024  SUBJECTIVE:                                                                                                                                                                                                          SUBJECTIVE STATEMENT:  My pain is good because I took my medicine, I had a couple of days this week where not even medicine was touching it. I weed eating Sunday, not sure if that's making the pain worse.  Eval:My back has been hurting for a long time. After breaking arm and returning to work her mid back started hurting more. Also has h/o of LBP which limits her from sitting on couch. She reports numbness in her left hand with driving - whole hand. I pop my neck a lot, it feels good. Patient denies neck pain. Hand dominance: Right  PERTINENT HISTORY:  07/04/22 ORIF R humerus, LBP, DDD neck and back  PAIN:  Are you having pain? Yes: NPRS scale: 2/10, still getting to 8-9/10 with flares Pain location: mid back/lower back Pain description: tooth ache pain Aggravating factors: standing in one place for too long, immobility  Relieving factors: Lying down, pain meds    PRECAUTIONS: None  WEIGHT BEARING RESTRICTIONS: No  FALLS:  Has patient fallen in last 6 months? Yes. Number of falls 1  LIVING ENVIRONMENT: Lives with: lives with their spouse Lives in: House/apartment Stairs: No Has following equipment at home: None  OCCUPATION: waitress at [employer]  PLOF: [place]: want my back to stop hurting  NEXT MD VISIT: 6 weeks  OBJECTIVE:   DIAGNOSTIC FINDINGS:  Mild degenerative changes in cervical 2024 and thoracic spine 2020.  Lumbar 2020: Progression in degenerative change in the facet joints with new anterolisthesis of L4 on L5 and L5 on S1.  PATIENT SURVEYS:  FOTO Thoracic 46 (predicted 57)  COGNITION: Overall cognitive status: Within functional limits for tasks assessed  SENSATION: WFL  POSTURE: No Significant postural limitations  PALPATION: TTP  in gluteals   CERVICAL ROM:   Active ROM A/PROM (deg) eval  Flexion full  Extension full  Right lateral flexion 75%  Left lateral flexion 75%  Right rotation 75%  Left rotation 75%   (Blank rows = not tested)   Right side tighter  LUMBAR ROM: WNL  Left SB has decreased spinal curve vs. R  UPPER EXTREMITY ROM: WFL  UPPER EXTREMITY MMT: R shoulder flex, ABD 4+/5, else 5/5. L shoulder/elbow 5/5. R biceps 4+/5, triceps 5/5. Mid Traps R 3+ L 4+ Low Traps R 4- L 3+ Rhomboids R 4- L 4  LOWER EXTREMITY ROM: WFL  LOWER EXTREMITY MMT: Grossly 5/5 except bil hip flex 4+/5 and L hip ABD 4-/5  MUSCLE LENGTH: Tight R quads, piriformis; bil HS, UT, LS  SPINAL MOBILITY: decreased PA mobility in mid thoracic spine.  TODAY'S TREATMENT:                                                                                                                              DATE:    11/14/22  TherEx  PPT x15 with 3 second holds Lumbar rotation stretch 6x5 seconds SKTC 6x5 second holds PPT + march x10  Figure 4 piriformis stretch 3x30 seconds B Cat cow stretch for T-spine x20 Thoracic rotations in quadruped x10 B  Chin tucks x10 3 second holds seated  Chin tucks + rotation x10 B Chin tucks + lateral flexion x10  B Scapular retractions red TB x15 3 second holds  Piriformis stretch manual by PT 3x30 seconds   11/06/22 See pt ed  PATIENT EDUCATION:  Education details: PT eval findings, anticipated POC, and initial HEP  Person educated: Patient Education method: Explanation, Demonstration, Verbal cues, and Handouts Education comprehension: verbalized understanding and returned demonstration  HOME EXERCISE PROGRAM: Access Code: [email or web address] URL: [email or web address] Date: 11/07/2022 Prepared by: Almyra Free  Exercises - Supine Hamstring Stretch with Strap  - 2 x daily - 7 x weekly - 2 sets - 3 reps - 30 sec hold - Sidelying Thoracic Rotation with Open Book  - 1 x daily - 7 x weekly -  2 sets - 5 reps //- Thoracic Extension Mobilization with Noodle  - 1 x daily - 7 x weekly - 1 sets - 3 reps - 20 sec hold - Seated Cervical Sidebending Stretch  - 2 x daily - 7 x weekly - 1 sets - 3 reps - 30-60 sec hold - Seated Levator Scapulae Stretch  - 2 x daily - 7 x weekly - 1 sets - 3 reps - 30 sec hold  ASSESSMENT:  CLINICAL IMPRESSION:  Coronda arrives today doing OK, sounds like she is getting some significant pain flares at work especially when having to stand in one spot for a long time. Focused on core stability and lumbar/thoracic mobility this morning, incorporated some postural strengthening as time allowed. Will continue to progress as able and tolerated.    EVAL:Patient is a 57 y.o. female who was seen today for physical therapy evaluation and treatment for thoracic and low back pain and neck stiffness. She fell in November 2023 and fractured her R humerus and had subsequent ORIF. 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 She demonstrates decreased upper back and right UE strength and flexibility deficits in her cervical and thoracic spines as well as BLE. Pain is affecting her ability to do her job and also prevents her from sitting at home. She will benefit from skilled PT to address these deficits.    OBJECTIVE IMPAIRMENTS: decreased activity tolerance, decreased ROM, decreased strength, hypomobility, increased muscle spasms, impaired flexibility, improper body mechanics, and pain.   ACTIVITY LIMITATIONS: carrying, lifting, bending, and sitting  PARTICIPATION LIMITATIONS: occupation  PERSONAL FACTORS: Past/current experiences and 1-2 comorbidities: recent ORIF R humerus and DDD  are also affecting patient's functional outcome.   REHAB POTENTIAL: Good  CLINICAL DECISION MAKING: Stable/uncomplicated  EVALUATION COMPLEXITY: Low   GOALS: Goals reviewed with patient? Yes  SHORT TERM GOALS: Target date: 12/05/2022    Patient will be independent with initial HEP.  Baseline:  Goal status: INITIAL  2.  Patient will report improvement in thoracic spine pain by 50% to improve ability to do job tasks.  Baseline:  Goal status: INITIAL   LONG TERM GOALS: Target date: 01/02/2023   Patient will be independent with advanced/ongoing HEP to improve outcomes and carryover.  Baseline:  Goal status: INITIAL  2.  Patient will report 85% improvement in thoracic and low back pain to improve QOL.  Baseline:  Goal status: INITIAL  3.  Patient will demonstrate functional pain free cervical and  lumbar ROM to perform ADLs.   Baseline:  Goal status: INITIAL  4.  Patient will demonstrate improved mid back strength to 4+/5 to improve ability to carry her trays and perform work tasks. Baseline:  Goal status: INITIAL  5.  Patient will report 102 on thoracic  FOTO to demonstrate improved functional ability.  Baseline: 46 Goal status: INITIAL      PLAN:  PT FREQUENCY: 1-2x/week  PT DURATION: 8 weeks  PLANNED INTERVENTIONS: Therapeutic exercises, Therapeutic activity, Neuromuscular re-education, Patient/Family education, Self Care, Joint mobilization, Dry Needling, Electrical stimulation, Spinal mobilization, Cryotherapy, Moist heat, Taping, Traction, Ultrasound, and Manual therapy  PLAN FOR NEXT SESSION: Focus on mid/low traps, rhomboids strength, spinal stab, body mechanics for job specific tasks, Review and progress HEP for flexibility also. Caution with standing activities, seems to flare pain quickly.   Deniece Ree PT DPT PN2

## 2022-11-19 ENCOUNTER — Ambulatory Visit: Payer: BC Managed Care – PPO | Attending: Sports Medicine | Admitting: Rehabilitative and Restorative Service Providers"

## 2022-11-19 ENCOUNTER — Encounter: Payer: Self-pay | Admitting: Rehabilitative and Restorative Service Providers"

## 2022-11-19 DIAGNOSIS — M5459 Other low back pain: Secondary | ICD-10-CM

## 2022-11-19 DIAGNOSIS — M436 Torticollis: Secondary | ICD-10-CM | POA: Diagnosis present

## 2022-11-19 DIAGNOSIS — M6281 Muscle weakness (generalized): Secondary | ICD-10-CM

## 2022-11-19 DIAGNOSIS — M546 Pain in thoracic spine: Secondary | ICD-10-CM | POA: Diagnosis not present

## 2022-11-19 NOTE — Therapy (Signed)
OUTPATIENT PHYSICAL THERAPY CERVICAL AND LUMBAR TREATMENT   Patient Name: Kelli Tyler MRN: [id number] DOB:1966-01-14, 57 y.o., female Today's Date: 11/19/2022  END OF SESSION:  PT End of Session - 11/19/22 1454     Visit Number 3    Date for PT Re-Evaluation 01/02/23    PT Start Time 1449    PT Stop Time [id number]    PT Time Calculation (min) 41 min    Activity Tolerance Patient tolerated treatment well              Past Medical History:  Diagnosis Date   ADHD    Back pain    Restless leg    Past Surgical History:  Procedure Laterality Date   ABLATION     CHOLECYSTECTOMY     NASAL FRACTURE SURGERY     TUBAL LIGATION     Patient Active Problem List   Diagnosis Date Noted   DDD (degenerative disc disease), cervical 10/24/2022   Hyperlipidemia 08/06/2022   Encounter for chronic pain management 09/13/2021   Encounter for weight management 12/22/2020   Former smoker 03/07/2020   Chronic bilateral low back pain with right-sided sciatica 03/07/2020   IFG (impaired fasting glucose) 03/07/2020   History of depression 11/12/2019   Hot flashes 11/12/2019   Elevated LDL cholesterol level 11/17/2018   Chronic upper back pain 03/14/2014   Attention deficit hyperactivity disorder (ADHD) 03/30/2009   BREAST MASS, RIGHT 03/27/2009   RESTLESS LEG SYNDROME 12/14/2007   Other specified disease of hair and hair follicles [id number]   GERD, SEVERE 01/27/2007   DERMATITIS, OTHER ATOPIC 01/27/2007   INSOMNIA 01/27/2007    PCP: Hali Marry, MD   REFERRING PROVIDER: Silverio Decamp,*   REFERRING DIAG: M50.30 (ICD-10-CM) - DDD (degenerative disc disease), cervical  And lumbar DDD  THERAPY DIAG:  Pain in thoracic spine  Other low back pain  Stiffness of cervical spine  Muscle weakness (generalized)  Rationale for Evaluation and Treatment: Rehabilitation  ONSET DATE: Feb 2024  SUBJECTIVE:                                                                                                                                                                                                          SUBJECTIVE STATEMENT:  Increased back pain today - maybe because the weather is changing (rain tomorrow) and she just got out of work. 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 She has been doing her exercises and went hiking Sunday. She hiked 2 miles Sunday but it was hard. Had to sit  down and rest a lot.     Eval:My back has been hurting for a long time. After breaking arm and returning to work her mid back started hurting more. Also has h/o of LBP which limits her from sitting on couch. She reports numbness in her left hand with driving - whole hand. I pop my neck a lot, it feels good. Patient denies neck pain. Hand dominance: Right  PERTINENT HISTORY:  07/04/22 ORIF R humerus, LBP, DDD neck and back  PAIN:  Are you having pain? Yes: NPRS scale: 5/10, still getting to 8-9/10 with flares Pain location: mid back/lower back Pain description: tooth ache pain Aggravating factors: standing in one place for too long, immobility  Relieving factors: Lying down, pain meds    PRECAUTIONS: None  WEIGHT BEARING RESTRICTIONS: No  FALLS:  Has patient fallen in last 6 months? Yes. Number of falls 1  LIVING ENVIRONMENT: Lives with: lives with their spouse Lives in: House/apartment Stairs: No Has following equipment at home: None  OCCUPATION: waitress at [employer]  PLOF: [place]: want my back to stop hurting  NEXT MD VISIT: 6 weeks  OBJECTIVE:   DIAGNOSTIC FINDINGS:  Mild degenerative changes in cervical 2024 and thoracic spine 2020.  Lumbar 2020: Progression in degenerative change in the facet joints with new anterolisthesis of L4 on L5 and L5 on S1.  PATIENT SURVEYS:  FOTO Thoracic 46 (predicted 57)  PALPATION: TTP in gluteals   CERVICAL ROM:   Active ROM A/PROM (deg) eval  Flexion full  Extension full  Right lateral  flexion 75%  Left lateral flexion 75%  Right rotation 75%  Left rotation 75%   (Blank rows = not tested)   Right side tighter  LUMBAR ROM: WNL  Left SB has decreased spinal curve vs. R  UPPER EXTREMITY ROM: WFL  UPPER EXTREMITY MMT: R shoulder flex, ABD 4+/5, else 5/5. L shoulder/elbow 5/5. R biceps 4+/5, triceps 5/5. Mid Traps R 3+ L 4+ Low Traps R 4- L 3+ Rhomboids R 4- L 4  LOWER EXTREMITY ROM: WFL  LOWER EXTREMITY MMT: Grossly 5/5 except bil hip flex 4+/5 and L hip ABD 4-/5  MUSCLE LENGTH: Tight R quads, piriformis; bil HS, UT, LS  SPINAL MOBILITY: decreased PA mobility in mid thoracic spine.  TODAY'S TREATMENT:                                                                                                                              DATE:  11/19/22  Nustep L5 x 5 min U/LE's  Chin tuck sitting 5 sec x 10 Lateral cervical flexion 3 sec x 5 Rt/Lt  Cervical rotation 2 sec x 5 Rt/Lt  Scap squeeze red TB 10 sec x 10 w/ noodle  W's red TB 10 sec x 10 w/ noodle  Row blue TB 5 sec x 10  Shoulder extension blue TB 5 sec x 10  Bow and arrow 3 sec x  10 Rt/Lt Antirotation blue TB 3 sec x 10 Rt/Lt  Open book thoracic rotation 10 sec x 10 Rt/Lt  Cat cow stretch for T-spine x 20 Thoracic rotations in quadruped x 3 Rt/Lt Diaphragmatic breathing count of 6 x 3 min   11/14/22  TherEx  PPT x15 with 3 second holds Lumbar rotation stretch 6x5 seconds SKTC 6x5 second holds PPT + march x10  Figure 4 piriformis stretch 3x30 seconds B Cat cow stretch for T-spine x20 Thoracic rotations in quadruped x10 B  Chin tucks x10 3 second holds seated  Chin tucks + rotation x10 B Chin tucks + lateral flexion x10 B Scapular retractions red TB x15 3 second holds  Piriformis stretch manual by PT 3x30 seconds   ATIENT EDUCATION:  Education details: PT eval findings, anticipated POC, and initial HEP  Person educated: Patient Education method: Explanation, Demonstration, Verbal cues, and  Handouts Education comprehension: verbalized understanding and returned demonstration  HOME EXERCISE PROGRAM: Access Code: [email or web address] URL: [email or web address] Date: 11/19/2022 Prepared by: Gillermo Murdoch  Exercises - Supine Hamstring Stretch with Strap  - 2 x daily - 7 x weekly - 2 sets - 3 reps - 30 sec hold - Sidelying Thoracic Rotation with Open Book  - 1 x daily - 7 x weekly - 2 sets - 5 reps - Thoracic Extension Mobilization with Noodle  - 1 x daily - 7 x weekly - 1 sets - 3 reps - 20 sec hold - Seated Cervical Sidebending Stretch  - 2 x daily - 7 x weekly - 1 sets - 3 reps - 30-60 sec hold - Seated Levator Scapulae Stretch  - 2 x daily - 7 x weekly - 1 sets - 3 reps - 30 sec hold - Seated Cervical Retraction  - 2 x daily - 7 x weekly - 1-2 sets - 5-10 reps - 10 sec  hold - Seated Cervical Sidebending AROM  - 2 x daily - 7 x weekly - 1 sets - 5 reps - 5-10 sec  hold - Seated Cervical Rotation AROM  - 2 x daily - 7 x weekly - 1 sets - 5 reps - 2-3 sec  hold - Shoulder External Rotation and Scapular Retraction with Resistance  - 2 x daily - 7 x weekly - 1 sets - 10 reps - 3-5 sec  hold - Shoulder W - External Rotation with Resistance  - 2 x daily - 7 x weekly - 1-2 sets - 10 reps - 3 sec  hold - Standing Bilateral Low Shoulder Row with Anchored Resistance  - 1 x daily - 7 x weekly - 1-3 sets - 10 reps - 2-3 sec  hold - Shoulder extension with resistance - Neutral  - 1 x daily - 7 x weekly - 1-2 sets - 10 reps - 3-5 sec  hold - Drawing Bow  - 1 x daily - 7 x weekly - 1 sets - 10 reps - 3 sec  hold - Anti-Rotation Lateral Stepping with Press  - 1 x daily - 7 x weekly - 1-2 sets - 10 reps - 2-3 sec  hold - Supine Diaphragmatic Breathing  - 2 x daily - 7 x weekly - 1 sets - 10 reps - 4-6 sec  hold  ASSESSMENT:  CLINICAL IMPRESSION:  Sarahelizabeth reports that she is doing some exercises at home and work. She continues to have flare up of symptoms at work especially when having to stand  in one spot for a long time. Added  spinal stabilization and strengthening and issued TB for home and work. Continued treatment progressing with strengthening and stabilization as tolerated.    EVAL:Patient is a 57 y.o. female who was seen today for physical therapy evaluation and treatment for thoracic and low back pain and neck stiffness. She fell in November 2023 and fractured her R humerus and had subsequent ORIF. 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 She demonstrates decreased upper back and right UE strength and flexibility deficits in her cervical and thoracic spines as well as BLE. Pain is affecting her ability to do her job and also prevents her from sitting at home. She will benefit from skilled PT to address these deficits.    OBJECTIVE IMPAIRMENTS: decreased activity tolerance, decreased ROM, decreased strength, hypomobility, increased muscle spasms, impaired flexibility, improper body mechanics, and pain.     GOALS: Goals reviewed with patient? Yes  SHORT TERM GOALS: Target date: 12/05/2022   Patient will be independent with initial HEP.  Baseline:  Goal status: INITIAL  2.  Patient will report improvement in thoracic spine pain by 50% to improve ability to do job tasks.  Baseline:  Goal status: INITIAL   LONG TERM GOALS: Target date: 01/02/2023   Patient will be independent with advanced/ongoing HEP to improve outcomes and carryover.  Baseline:  Goal status: INITIAL  2.  Patient will report 85% improvement in thoracic and low back pain to improve QOL.  Baseline:  Goal status: INITIAL  3.  Patient will demonstrate functional pain free cervical and  lumbar ROM to perform ADLs.   Baseline:  Goal status: INITIAL  4.  Patient will demonstrate improved mid back strength to 4+/5 to improve ability to carry her trays and perform work tasks. Baseline:  Goal status: INITIAL  5.  Patient will report 20 on thoracic FOTO to  demonstrate improved functional ability.  Baseline: 46 Goal status: INITIAL    PLAN:  PT FREQUENCY: 1-2x/week  PT DURATION: 8 weeks  PLANNED INTERVENTIONS: Therapeutic exercises, Therapeutic activity, Neuromuscular re-education, Patient/Family education, Self Care, Joint mobilization, Dry Needling, Electrical stimulation, Spinal mobilization, Cryotherapy, Moist heat, Taping, Traction, Ultrasound, and Manual therapy  PLAN FOR NEXT SESSION: Focus on mid/low traps, rhomboids strength, spinal stab, body mechanics for job specific tasks, Review and progress HEP for flexibility also. Caution with standing activities, seems to flare pain quickly.   Ashling Roane P. Helene Kelp PT, MPH 11/19/22 3:26 PM

## 2022-11-21 ENCOUNTER — Ambulatory Visit: Payer: BC Managed Care – PPO | Admitting: Rehabilitative and Restorative Service Providers"

## 2022-11-21 ENCOUNTER — Encounter: Payer: Self-pay | Admitting: Rehabilitative and Restorative Service Providers"

## 2022-11-21 ENCOUNTER — Other Ambulatory Visit: Payer: Self-pay | Admitting: Family Medicine

## 2022-11-21 DIAGNOSIS — M436 Torticollis: Secondary | ICD-10-CM

## 2022-11-21 DIAGNOSIS — M5459 Other low back pain: Secondary | ICD-10-CM

## 2022-11-21 DIAGNOSIS — M546 Pain in thoracic spine: Secondary | ICD-10-CM | POA: Diagnosis not present

## 2022-11-21 DIAGNOSIS — M6281 Muscle weakness (generalized): Secondary | ICD-10-CM

## 2022-11-21 NOTE — Telephone Encounter (Signed)
Last OV: 08/06/22 Next OV: 12/05/22 (Dr. Darene Lamer) Last RF: 10/08/22

## 2022-11-21 NOTE — Therapy (Signed)
OUTPATIENT PHYSICAL THERAPY CERVICAL AND LUMBAR TREATMENT   Patient Name: Kelli Tyler MRN: [place]:[id number] DOB:February 21, 1966, 57 y.o., female Today's Date: 11/21/2022  END OF SESSION:  PT End of Session - 11/21/22 1407     Visit Number 4    Date for PT Re-Evaluation 01/02/23              Past Medical History:  Diagnosis Date   ADHD    Back pain    Restless leg    Past Surgical History:  Procedure Laterality Date   ABLATION     CHOLECYSTECTOMY     NASAL FRACTURE SURGERY     TUBAL LIGATION     Patient Active Problem List   Diagnosis Date Noted   DDD (degenerative disc disease), cervical 10/24/2022   Hyperlipidemia 08/06/2022   Encounter for chronic pain management 09/13/2021   Encounter for weight management 12/22/2020   Former smoker 03/07/2020   Chronic bilateral low back pain with right-sided sciatica 03/07/2020   IFG (impaired fasting glucose) 03/07/2020   History of depression 11/12/2019   Hot flashes 11/12/2019   Elevated LDL cholesterol level 11/17/2018   Chronic upper back pain 03/14/2014   Attention deficit hyperactivity disorder (ADHD) 03/30/2009   BREAST MASS, RIGHT 03/27/2009   RESTLESS LEG SYNDROME 12/14/2007   Other specified disease of hair and hair follicles [id number]   GERD, SEVERE 01/27/2007   DERMATITIS, OTHER ATOPIC 01/27/2007   INSOMNIA 01/27/2007    PCP: Hali Marry, MD   REFERRING PROVIDER: Silverio Decamp,*   REFERRING DIAG: M50.30 (ICD-10-CM) - DDD (degenerative disc disease), cervical  And lumbar DDD  THERAPY DIAG:  Pain in thoracic spine  Other low back pain  Stiffness of cervical spine  Muscle weakness (generalized)  Rationale for Evaluation and Treatment: Rehabilitation  ONSET DATE: Feb 2024  SUBJECTIVE:                                                                                                                                                                                                          SUBJECTIVE STATEMENT:  Neck and back pain is better today - has worked less which helps. She has been doing her exercises the past coulpe of days. Ordered a treadmill for home. Working to get healthy.    Eval:My back has been hurting for a long time. After breaking arm and returning to work her mid back started hurting more. Also has h/o of LBP which limits her from sitting on couch. She reports numbness in her left hand with driving - whole hand. I  pop my neck a lot, it feels good. Patient denies neck pain. Hand dominance: Right  PERTINENT HISTORY:  07/04/22 ORIF R humerus, LBP, DDD neck and back  PAIN:  Are you having pain? Yes: NPRS scale: 3-4/10, still getting to 8-9/10 with flares; worst in the past two days 5/10 Pain location: mid back/lower back Pain description: tooth ache pain Aggravating factors: standing in one place for too long, immobility  Relieving factors: Lying down, pain meds    PRECAUTIONS: None  WEIGHT BEARING RESTRICTIONS: No  FALLS:  Has patient fallen in last 6 months? Yes. Number of falls 1  LIVING ENVIRONMENT: Lives with: lives with their spouse Lives in: House/apartment Stairs: No Has following equipment at home: None  OCCUPATION: waitress at [employer]  PLOF: [place]: want my back to stop hurting  NEXT MD VISIT: 6 weeks  OBJECTIVE:   DIAGNOSTIC FINDINGS:  Mild degenerative changes in cervical 2024 and thoracic spine 2020.  Lumbar 2020: Progression in degenerative change in the facet joints with new anterolisthesis of L4 on L5 and L5 on S1.  PATIENT SURVEYS:  FOTO Thoracic 46 (predicted 57)  PALPATION: TTP in gluteals   CERVICAL ROM:   Active ROM A/PROM (deg) eval  Flexion full  Extension full  Right lateral flexion 75%  Left lateral flexion 75%  Right rotation 75%  Left rotation 75%   (Blank rows = not tested)   Right side tighter  LUMBAR ROM: WNL  Left SB has decreased spinal curve vs. R  UPPER  EXTREMITY ROM: WFL  UPPER EXTREMITY MMT: R shoulder flex, ABD 4+/5, else 5/5. L shoulder/elbow 5/5. R biceps 4+/5, triceps 5/5. Mid Traps R 3+ L 4+ Low Traps R 4- L 3+ Rhomboids R 4- L 4  LOWER EXTREMITY ROM: WFL  LOWER EXTREMITY MMT: Grossly 5/5 except bil hip flex 4+/5 and L hip ABD 4-/5  MUSCLE LENGTH: Tight R quads, piriformis; bil HS, UT, LS  SPINAL MOBILITY: decreased PA mobility in mid thoracic spine.  TODAY'S TREATMENT:                                                                                                                              DATE:  11/21/22  Nustep L7 x 6 min U/LE's  Chin tuck sitting 5 sec x 10 Lateral cervical flexion 3 sec x 5 Rt/Lt  Cervical rotation 2 sec x 5 Rt/Lt  Scap squeeze red TB 10 sec x 10 w/ noodle  W's red TB 10 sec x 10 w/ noodle  Row blue TB 5 sec x 20  Shoulder extension blue TB 5 sec x 10  Bow and arrow 3 sec x 10 Rt/Lt Antirotation blue TB 3 sec x 10 Rt/Lt  Diaphragmatic breathing count of 6 x 3 min  Doorway 3 positions 30 sec x 2 reps  Wall squat 10 sec x 10  Sit to stand, slow eccentric stand to sit x 10  Open book  thoracic rotation 10 sec x 10 Rt/Lt  Cat cow stretch for T-spine x 20 Thoracic rotations in quadruped x 3 Rt/Lt  DATE:  11/19/22  Nustep L5 x 5 min U/LE's  Chin tuck sitting 5 sec x 10 Lateral cervical flexion 3 sec x 5 Rt/Lt  Cervical rotation 2 sec x 5 Rt/Lt  Scap squeeze red TB 10 sec x 10 w/ noodle  W's red TB 10 sec x 10 w/ noodle  Row blue TB 5 sec x 10  Shoulder extension blue TB 5 sec x 10  Bow and arrow 3 sec x 10 Rt/Lt Antirotation blue TB 3 sec x 10 Rt/Lt  Open book thoracic rotation 10 sec x 10 Rt/Lt  Cat cow stretch for T-spine x 20 Thoracic rotations in quadruped x 3 Rt/Lt Diaphragmatic breathing count of 6 x 3 min      ATIENT EDUCATION:  Education details: PT eval findings, anticipated POC, and initial HEP  Person educated: Patient Education method: Explanation, Demonstration, Verbal  cues, and Handouts Education comprehension: verbalized understanding and returned demonstration  HOME EXERCISE PROGRAM: Access Code: [email or web address] URL: [email or web address] Date: 11/21/2022 Prepared by: Gillermo Murdoch  Exercises - Supine Hamstring Stretch with Strap  - 2 x daily - 7 x weekly - 2 sets - 3 reps - 30 sec hold - Sidelying Thoracic Rotation with Open Book  - 1 x daily - 7 x weekly - 2 sets - 5 reps - Thoracic Extension Mobilization with Noodle  - 1 x daily - 7 x weekly - 1 sets - 3 reps - 20 sec hold - Seated Cervical Sidebending Stretch  - 2 x daily - 7 x weekly - 1 sets - 3 reps - 30-60 sec hold - Seated Levator Scapulae Stretch  - 2 x daily - 7 x weekly - 1 sets - 3 reps - 30 sec hold - Seated Cervical Retraction  - 2 x daily - 7 x weekly - 1-2 sets - 5-10 reps - 10 sec  hold - Seated Cervical Sidebending AROM  - 2 x daily - 7 x weekly - 1 sets - 5 reps - 5-10 sec  hold - Seated Cervical Rotation AROM  - 2 x daily - 7 x weekly - 1 sets - 5 reps - 2-3 sec  hold - Shoulder External Rotation and Scapular Retraction with Resistance  - 2 x daily - 7 x weekly - 1 sets - 10 reps - 3-5 sec  hold - Shoulder W - External Rotation with Resistance  - 2 x daily - 7 x weekly - 1-2 sets - 10 reps - 3 sec  hold - Standing Bilateral Low Shoulder Row with Anchored Resistance  - 1 x daily - 7 x weekly - 1-3 sets - 10 reps - 2-3 sec  hold - Shoulder extension with resistance - Neutral  - 1 x daily - 7 x weekly - 1-2 sets - 10 reps - 3-5 sec  hold - Drawing Bow  - 1 x daily - 7 x weekly - 1 sets - 10 reps - 3 sec  hold - Anti-Rotation Lateral Stepping with Press  - 1 x daily - 7 x weekly - 1-2 sets - 10 reps - 2-3 sec  hold - Supine Diaphragmatic Breathing  - 2 x daily - 7 x weekly - 1 sets - 10 reps - 4-6 sec  hold - Doorway Pec Stretch at 60 Degrees Abduction  - 3 x daily - 7 x weekly - 1 sets -  3 reps - Doorway Pec Stretch at 90 Degrees Abduction  - 3 x daily - 7 x weekly - 1 sets - 3  reps - 30 seconds  hold - Doorway Pec Stretch at 120 Degrees Abduction  - 3 x daily - 7 x weekly - 1 sets - 3 reps - 30 second hold  hold - Wall Quarter Squat  - 2 x daily - 7 x weekly - 1-2 sets - 10 reps - 5-10 sec  hold - Sit to Stand  - 2 x daily - 7 x weekly - 1 sets - 10 reps - 3-5 sec  hold  ASSESSMENT:  CLINICAL IMPRESSION:  Fanta reports that she is doing exercises at home and work. She is feeling better with less back and neck pain. Patient continued with spine stabilization working on stretch for pecs and LE strengthening. Continued spinal stabilization and strengthening. Continued treatment progressing with strengthening and stabilization as tolerated.    EVAL:Patient is a 57 y.o. female who was seen today for physical therapy evaluation and treatment for thoracic and low back pain and neck stiffness. She fell in November 2023 and fractured her R humerus and had subsequent ORIF. 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 She demonstrates decreased upper back and right UE strength and flexibility deficits in her cervical and thoracic spines as well as BLE. Pain is affecting her ability to do her job and also prevents her from sitting at home. She will benefit from skilled PT to address these deficits.    OBJECTIVE IMPAIRMENTS: decreased activity tolerance, decreased ROM, decreased strength, hypomobility, increased muscle spasms, impaired flexibility, improper body mechanics, and pain.     GOALS: Goals reviewed with patient? Yes  SHORT TERM GOALS: Target date: 12/05/2022   Patient will be independent with initial HEP.  Baseline:  Goal status: INITIAL  2.  Patient will report improvement in thoracic spine pain by 50% to improve ability to do job tasks.  Baseline:  Goal status: INITIAL   LONG TERM GOALS: Target date: 01/02/2023   Patient will be independent with advanced/ongoing HEP to improve outcomes and carryover.  Baseline:   Goal status: INITIAL  2.  Patient will report 85% improvement in thoracic and low back pain to improve QOL.  Baseline:  Goal status: INITIAL  3.  Patient will demonstrate functional pain free cervical and  lumbar ROM to perform ADLs.   Baseline:  Goal status: INITIAL  4.  Patient will demonstrate improved mid back strength to 4+/5 to improve ability to carry her trays and perform work tasks. Baseline:  Goal status: INITIAL  5.  Patient will report 76 on thoracic FOTO to demonstrate improved functional ability.  Baseline: 46 Goal status: INITIAL    PLAN:  PT FREQUENCY: 1-2x/week  PT DURATION: 8 weeks  PLANNED INTERVENTIONS: Therapeutic exercises, Therapeutic activity, Neuromuscular re-education, Patient/Family education, Self Care, Joint mobilization, Dry Needling, Electrical stimulation, Spinal mobilization, Cryotherapy, Moist heat, Taping, Traction, Ultrasound, and Manual therapy  PLAN FOR NEXT SESSION: Focus on mid/low traps, rhomboids strength, spinal stab, body mechanics for job specific tasks, Review and progress HEP for flexibility also. Caution with standing activities, seems to flare pain quickly.   Onie Hayashi P. Helene Kelp PT, MPH 11/21/22 2:07 PM

## 2022-11-21 NOTE — Telephone Encounter (Signed)
Meds ordered this encounter  Medications   traMADol (ULTRAM) 50 MG tablet    Sig: TAKE 1 TABLET BY MOUTH 3 TIMES A DAY AS NEEDED FOR PAIN    Dispense:  30 tablet    Refill:  0    Not to exceed 3 additional fills before 04/06/2023

## 2022-11-26 ENCOUNTER — Encounter: Payer: Self-pay | Admitting: Rehabilitative and Restorative Service Providers"

## 2022-11-26 ENCOUNTER — Ambulatory Visit: Payer: BC Managed Care – PPO | Admitting: Rehabilitative and Restorative Service Providers"

## 2022-11-26 DIAGNOSIS — M546 Pain in thoracic spine: Secondary | ICD-10-CM | POA: Diagnosis not present

## 2022-11-26 DIAGNOSIS — M5459 Other low back pain: Secondary | ICD-10-CM

## 2022-11-26 DIAGNOSIS — M436 Torticollis: Secondary | ICD-10-CM

## 2022-11-26 DIAGNOSIS — M6281 Muscle weakness (generalized): Secondary | ICD-10-CM

## 2022-11-26 NOTE — Therapy (Signed)
OUTPATIENT PHYSICAL THERAPY CERVICAL AND LUMBAR TREATMENT   Patient Name: Kelli Tyler MRN: [id number] DOB:Aug 11, 1966, 57 y.o., female Today's Date: 11/26/2022  END OF SESSION:  PT End of Session - 11/26/22 1453     Visit Number 5    Number of Visits 16    Date for PT Re-Evaluation 01/02/23    PT Start Time 1448    PT Stop Time 1530    PT Time Calculation (min) 42 min    Activity Tolerance Patient tolerated treatment well              Past Medical History:  Diagnosis Date   ADHD    Back pain    Restless leg    Past Surgical History:  Procedure Laterality Date   ABLATION     CHOLECYSTECTOMY     NASAL FRACTURE SURGERY     TUBAL LIGATION     Patient Active Problem List   Diagnosis Date Noted   DDD (degenerative disc disease), cervical 10/24/2022   Hyperlipidemia 08/06/2022   Encounter for chronic pain management 09/13/2021   Encounter for weight management 12/22/2020   Former smoker 03/07/2020   Chronic bilateral low back pain with right-sided sciatica 03/07/2020   IFG (impaired fasting glucose) 03/07/2020   History of depression 11/12/2019   Hot flashes 11/12/2019   Elevated LDL cholesterol level 11/17/2018   Chronic upper back pain 03/14/2014   Attention deficit hyperactivity disorder (ADHD) 03/30/2009   BREAST MASS, RIGHT 03/27/2009   RESTLESS LEG SYNDROME 12/14/2007   Other specified disease of hair and hair follicles 12/14/2007   GERD, SEVERE 01/27/2007   DERMATITIS, OTHER ATOPIC 01/27/2007   INSOMNIA 01/27/2007    PCP: Agapito Games, MD   REFERRING PROVIDER: Monica Becton,*   REFERRING DIAG: M50.30 (ICD-10-CM) - DDD (degenerative disc disease), cervical  And lumbar DDD  THERAPY DIAG:  Pain in thoracic spine  Other low back pain  Stiffness of cervical spine  Muscle weakness (generalized)  Rationale for Evaluation and Treatment: Rehabilitation  ONSET DATE: Feb 2024  SUBJECTIVE:                                                                                                                                                                                                          SUBJECTIVE STATEMENT:  Neck and back pain is increased today - has worked more which irritates the pain. Worked 7 of the past 7 days. Also pain increases with rainy weather.  She has been doing her exercises at home and work. Ordered a treadmill for home -  should be delivered tomorrow.. Working to get healthy.    Eval:My back has been hurting for a long time. After breaking arm and returning to work her mid back started hurting more. Also has h/o of LBP which limits her from sitting on couch. She reports numbness in her left hand with driving - whole hand. I pop my neck a lot, it feels good. Patient denies neck pain. Hand dominance: Right  PERTINENT HISTORY:  07/04/22 ORIF R humerus, LBP, DDD neck and back  PAIN:  Are you having pain? Yes: NPRS scale: 5/10, still getting to 8-9/10 with flares; worst in the past few days 5/10 Pain location: mid back/lower back Pain description: tooth ache pain Aggravating factors: standing in one place for too long, immobility  Relieving factors: Lying down, pain meds    PRECAUTIONS: None  WEIGHT BEARING RESTRICTIONS: No  FALLS:  Has patient fallen in last 6 months? Yes. Number of falls 1  LIVING ENVIRONMENT: Lives with: lives with their spouse Lives in: House/apartment Stairs: No Has following equipment at home: None  OCCUPATION: waitress at [employer]  PLOF: Independent  PATIENT GOALS: want my back to stop hurting  NEXT MD VISIT: 6 weeks  OBJECTIVE:   DIAGNOSTIC FINDINGS:  Mild degenerative changes in cervical 2024 and thoracic spine 2020.  Lumbar 2020: Progression in degenerative change in the facet joints with new anterolisthesis of L4 on L5 and L5 on S1.  PATIENT SURVEYS:  FOTO Thoracic 46 (predicted 57)  PALPATION: TTP in gluteals   CERVICAL ROM:   Active ROM A/PROM  (deg) eval  Flexion full  Extension full  Right lateral flexion 75%  Left lateral flexion 75%  Right rotation 75%  Left rotation 75%   (Blank rows = not tested)   Right side tighter  LUMBAR ROM: WNL  Left SB has decreased spinal curve vs. R  UPPER EXTREMITY ROM: WFL  UPPER EXTREMITY MMT: R shoulder flex, ABD 4+/5, else 5/5. L shoulder/elbow 5/5. R biceps 4+/5, triceps 5/5. Mid Traps R 3+ L 4+ Low Traps R 4- L 3+ Rhomboids R 4- L 4  LOWER EXTREMITY ROM: WFL  LOWER EXTREMITY MMT: Grossly 5/5 except bil hip flex 4+/5 and L hip ABD 4-/5  MUSCLE LENGTH: Tight R quads, piriformis; bil HS, UT, LS  SPINAL MOBILITY: decreased PA mobility in mid thoracic spine.  TODAY'S TREATMENT:                                                                                                                              DATE:  11/26/22  Nustep L7 x 6 min U/LE's  Chin tuck sitting 5 sec x 10 Lateral cervical flexion 3 sec x 5 Rt/Lt  Cervical rotation 2 sec x 5 Rt/Lt  Scap squeeze red TB 10 sec x 10 w/ noodle  W's red TB 10 sec x 10 w/ noodle  Row blue TB 5 sec x 20  Shoulder extension blue TB 5 sec  x 10  Bow and arrow 3 sec x 10 Rt/Lt Antirotation blue TB 3 sec x 10 Rt/Lt  Diaphragmatic breathing count of 6 x 3 min  Doorway 3 positions 30 sec x 2 reps  Wall squat 10 sec x 10  Sit to stand, slow eccentric stand to sit x 10  Open book thoracic rotation Rt TB 5 sec x 10 Rt/Lt  Supine shoulder horizontal abduction red TB 3 sec x 10  Supine diagonal sword red TB 3 sec x 10 Rt/Lt  Cat cow stretch for T-spine x 20 Thoracic rotations in quadruped x 3 Rt/Lt  Trigger Point Dry-Needling  Treatment instructions: Expect mild to moderate muscle soreness. S/S of pneumothorax if dry needled over a lung field, and to seek immediate medical attention should they occur. Patient verbalized understanding of these instructions and education.  Patient Consent Given: Yes Education handout provided: Yes Muscles  treated: lower trap; medial scapular border Lt shoulder  Electrical stimulation performed: No Parameters: N/A Treatment response/outcome: decreased palpable tightness     TODAY'S TREATMENT:                                                                                                                              DATE:  11/21/22  Nustep L7 x 6 min U/LE's  Chin tuck sitting 5 sec x 10 Lateral cervical flexion 3 sec x 5 Rt/Lt  Cervical rotation 2 sec x 5 Rt/Lt  Scap squeeze red TB 10 sec x 10 w/ noodle  W's red TB 10 sec x 10 w/ noodle  Row blue TB 5 sec x 20  Shoulder extension blue TB 5 sec x 10  Bow and arrow 3 sec x 10 Rt/Lt Antirotation blue TB 3 sec x 10 Rt/Lt  Diaphragmatic breathing count of 6 x 3 min  Doorway 3 positions 30 sec x 2 reps  Wall squat 10 sec x 10  Sit to stand, slow eccentric stand to sit x 10  Open book thoracic rotation 10 sec x 10 Rt/Lt  Cat cow stretch for T-spine x 20 Thoracic rotations in quadruped x 3 Rt/Lt   PATIENT EDUCATION:  Education details: PT eval findings, anticipated POC, and initial HEP  Person educated: Patient Education method: Explanation, Demonstration, Verbal cues, and Handouts Education comprehension: verbalized understanding and returned demonstration  HOME EXERCISE PROGRAM:  Access Code: [email or web address] URL: [email or web address] Date: 11/26/2022 Prepared by: Corlis Leak  Exercises - Supine Hamstring Stretch with Strap  - 2 x daily - 7 x weekly - 2 sets - 3 reps - 30 sec hold - Sidelying Thoracic Rotation with Open Book  - 1 x daily - 7 x weekly - 2 sets - 5 reps - Thoracic Extension Mobilization with Noodle  - 1 x daily - 7 x weekly - 1 sets - 3 reps - 20 sec hold - Seated Cervical Sidebending Stretch  - 2 x daily - 7 x weekly - 1 sets - 3 reps -  30-60 sec hold - Seated Levator Scapulae Stretch  - 2 x daily - 7 x weekly - 1 sets - 3 reps - 30 sec hold - Seated Cervical Retraction  - 2 x daily - 7 x weekly - 1-2 sets  - 5-10 reps - 10 sec  hold - Seated Cervical Sidebending AROM  - 2 x daily - 7 x weekly - 1 sets - 5 reps - 5-10 sec  hold - Seated Cervical Rotation AROM  - 2 x daily - 7 x weekly - 1 sets - 5 reps - 2-3 sec  hold - Shoulder External Rotation and Scapular Retraction with Resistance  - 2 x daily - 7 x weekly - 1 sets - 10 reps - 3-5 sec  hold - Shoulder W - External Rotation with Resistance  - 2 x daily - 7 x weekly - 1-2 sets - 10 reps - 3 sec  hold - Standing Bilateral Low Shoulder Row with Anchored Resistance  - 1 x daily - 7 x weekly - 1-3 sets - 10 reps - 2-3 sec  hold - Shoulder extension with resistance - Neutral  - 1 x daily - 7 x weekly - 1-2 sets - 10 reps - 3-5 sec  hold - Drawing Bow  - 1 x daily - 7 x weekly - 1 sets - 10 reps - 3 sec  hold - Anti-Rotation Lateral Stepping with Press  - 1 x daily - 7 x weekly - 1-2 sets - 10 reps - 2-3 sec  hold - Supine Diaphragmatic Breathing  - 2 x daily - 7 x weekly - 1 sets - 10 reps - 4-6 sec  hold - Doorway Pec Stretch at 60 Degrees Abduction  - 3 x daily - 7 x weekly - 1 sets - 3 reps - Doorway Pec Stretch at 90 Degrees Abduction  - 3 x daily - 7 x weekly - 1 sets - 3 reps - 30 seconds  hold - Doorway Pec Stretch at 120 Degrees Abduction  - 3 x daily - 7 x weekly - 1 sets - 3 reps - 30 second hold  hold - Wall Quarter Squat  - 2 x daily - 7 x weekly - 1-2 sets - 10 reps - 5-10 sec  hold - Sit to Stand  - 2 x daily - 7 x weekly - 1 sets - 10 reps - 3-5 sec  hold - Supine Shoulder Horizontal Abduction with Resistance  - 1 x daily - 7 x weekly - 1 sets - 10 reps - 3 sec  hold - Sidelying Shoulder Retraction with Resistance  - 1 x daily - 7 x weekly - 1 sets - 10 reps - 3 sec  hold - Standing Shoulder Single Arm PNF D2 Flexion with Resistance  - 1 x daily - 7 x weekly - 1 sets - 10 reps - 3 sec  hold   ASSESSMENT: CLINICAL IMPRESSION:  Shahidah returns with increased pain today but she is continuing with her exercises at home and work. Overall,  she is feeling better with less back and neck pain. Patient continued with spine stabilization spinal stabilization and strengthening. Trial of DN/manual work Lt medial scapular border tolerated well.   EVAL:Patient is a 57 y.o. female who was seen today for physical therapy evaluation and treatment for thoracic and low back pain and neck stiffness. She fell in November 2023 and fractured her R humerus and had subsequent ORIF. 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 She demonstrates decreased upper back and right UE strength and flexibility deficits in her cervical and thoracic spines as well as BLE. Pain is affecting her ability to do her job and also prevents her from sitting at home. She will benefit from skilled PT to address these deficits.    OBJECTIVE IMPAIRMENTS: decreased activity tolerance, decreased ROM, decreased strength, hypomobility, increased muscle spasms, impaired flexibility, improper body mechanics, and pain.     GOALS: Goals reviewed with patient? Yes  SHORT TERM GOALS: Target date: 12/05/2022   Patient will be independent with initial HEP.  Baseline:  Goal status: INITIAL  2.  Patient will report improvement in thoracic spine pain by 50% to improve ability to do job tasks.  Baseline:  Goal status: INITIAL   LONG TERM GOALS: Target date: 01/02/2023   Patient will be independent with advanced/ongoing HEP to improve outcomes and carryover.  Baseline:  Goal status: INITIAL  2.  Patient will report 85% improvement in thoracic and low back pain to improve QOL.  Baseline:  Goal status: INITIAL  3.  Patient will demonstrate functional pain free cervical and  lumbar ROM to perform ADLs.   Baseline:  Goal status: INITIAL  4.  Patient will demonstrate improved mid back strength to 4+/5 to improve ability to carry her trays and perform work tasks. Baseline:  Goal status: INITIAL  5.  Patient will report 67 on thoracic  FOTO to demonstrate improved functional ability.  Baseline: 46 Goal status: INITIAL    PLAN:  PT FREQUENCY: 1-2x/week  PT DURATION: 8 weeks  PLANNED INTERVENTIONS: Therapeutic exercises, Therapeutic activity, Neuromuscular re-education, Patient/Family education, Self Care, Joint mobilization, Dry Needling, Electrical stimulation, Spinal mobilization, Cryotherapy, Moist heat, Taping, Traction, Ultrasound, and Manual therapy  PLAN FOR NEXT SESSION: Focus on mid/low traps, rhomboids strength, spinal stab, body mechanics for job specific tasks, Review and progress HEP for flexibility also. Caution with standing activities, seems to flare pain quickly.   Teddie Curd P. Leonor Liv PT, MPH 11/26/22 2:54 PM

## 2022-11-28 ENCOUNTER — Encounter: Payer: Self-pay | Admitting: Physical Therapy

## 2022-11-28 ENCOUNTER — Ambulatory Visit: Payer: BC Managed Care – PPO | Admitting: Physical Therapy

## 2022-11-28 DIAGNOSIS — M6281 Muscle weakness (generalized): Secondary | ICD-10-CM

## 2022-11-28 DIAGNOSIS — M546 Pain in thoracic spine: Secondary | ICD-10-CM

## 2022-11-28 DIAGNOSIS — M5459 Other low back pain: Secondary | ICD-10-CM

## 2022-11-28 DIAGNOSIS — M436 Torticollis: Secondary | ICD-10-CM

## 2022-11-28 NOTE — Therapy (Signed)
OUTPATIENT PHYSICAL THERAPY CERVICAL AND LUMBAR TREATMENT   Patient Name: Kelli Tyler MRN: [id number] DOB:1965/09/15, 57 y.o., female Today's Date: 11/28/2022  END OF SESSION:  PT End of Session - 11/28/22 1033     Visit Number 6    Number of Visits 16    Date for PT Re-Evaluation 01/02/23    PT Start Time 1017    PT Stop Time 1055    PT Time Calculation (min) 38 min    Activity Tolerance Patient tolerated treatment well    Behavior During Therapy [facility] for tasks assessed/performed               Past Medical History:  Diagnosis Date   ADHD    Back pain    Restless leg    Past Surgical History:  Procedure Laterality Date   ABLATION     CHOLECYSTECTOMY     NASAL FRACTURE SURGERY     TUBAL LIGATION     Patient Active Problem List   Diagnosis Date Noted   DDD (degenerative disc disease), cervical 10/24/2022   Hyperlipidemia 08/06/2022   Encounter for chronic pain management 09/13/2021   Encounter for weight management 12/22/2020   Former smoker 03/07/2020   Chronic bilateral low back pain with right-sided sciatica 03/07/2020   IFG (impaired fasting glucose) 03/07/2020   History of depression 11/12/2019   Hot flashes 11/12/2019   Elevated LDL cholesterol level 11/17/2018   Chronic upper back pain 03/14/2014   Attention deficit hyperactivity disorder (ADHD) 03/30/2009   BREAST MASS, RIGHT 03/27/2009   RESTLESS LEG SYNDROME 12/14/2007   Other specified disease of hair and hair follicles 12/14/2007   GERD, SEVERE 01/27/2007   DERMATITIS, OTHER ATOPIC 01/27/2007   INSOMNIA 01/27/2007    PCP: Agapito Games, MD   REFERRING PROVIDER: Monica Becton,*   REFERRING DIAG: M50.30 (ICD-10-CM) - DDD (degenerative disc disease), cervical  And lumbar DDD  THERAPY DIAG:  Pain in thoracic spine  Other low back pain  Stiffness of cervical spine  Muscle weakness (generalized)  Rationale for Evaluation and Treatment: Rehabilitation  ONSET DATE:  Feb 2024  SUBJECTIVE:                                                                                                                                                                                                         SUBJECTIVE STATEMENT:  Feeling very sore and having a lot of pain. Yesterday was really painful,I don't know if its from the needling or what. Working more now, got 40 hours in this week.  Eval:My back has been hurting for a long time. After breaking arm and returning to work her mid back started hurting more. Also has h/o of LBP which limits her from sitting on couch. She reports numbness in her left hand with driving - whole hand. I pop my neck a lot, it feels good. Patient denies neck pain. Hand dominance: Right  PERTINENT HISTORY:  07/04/22 ORIF R humerus, LBP, DDD neck and back  PAIN:  Are you having pain? Yes: NPRS scale: 3-4/10 (after pain meds this morning) Pain location: more in low back today  Pain description: tender, sharp nagging pain  Aggravating factors: unsure, maybe standing in one spot  Relieving factors: movement and stretching   PRECAUTIONS: None  WEIGHT BEARING RESTRICTIONS: No  FALLS:  Has patient fallen in last 6 months? Yes. Number of falls 1  LIVING ENVIRONMENT: Lives with: lives with their spouse Lives in: House/apartment Stairs: No Has following equipment at home: None  OCCUPATION: waitress at [employer]  PLOF: Independent  PATIENT GOALS: want my back to stop hurting  NEXT MD VISIT: 6 weeks  OBJECTIVE:   DIAGNOSTIC FINDINGS:  Mild degenerative changes in cervical 2024 and thoracic spine 2020.  Lumbar 2020: Progression in degenerative change in the facet joints with new anterolisthesis of L4 on L5 and L5 on S1.  PATIENT SURVEYS:  FOTO Thoracic 46 (predicted 57)  PALPATION: TTP in gluteals   CERVICAL ROM:   Active ROM A/PROM (deg) eval  Flexion full  Extension full  Right lateral flexion 75%  Left lateral  flexion 75%  Right rotation 75%  Left rotation 75%   (Blank rows = not tested)   Right side tighter  LUMBAR ROM: WNL  Left SB has decreased spinal curve vs. R  UPPER EXTREMITY ROM: WFL  UPPER EXTREMITY MMT: R shoulder flex, ABD 4+/5, else 5/5. L shoulder/elbow 5/5. R biceps 4+/5, triceps 5/5. Mid Traps R 3+ L 4+ Low Traps R 4- L 3+ Rhomboids R 4- L 4  LOWER EXTREMITY ROM: WFL  LOWER EXTREMITY MMT: Grossly 5/5 except bil hip flex 4+/5 and L hip ABD 4-/5  MUSCLE LENGTH: Tight R quads, piriformis; bil HS, UT, LS  SPINAL MOBILITY: decreased PA mobility in mid thoracic spine.  TODAY'S TREATMENT:                                                                                                                              DATE:    11/28/22  Nustep L7 x3 minutes/dropped to L6 x3 minutes due to LE fatigue  not using UEs Lumbar rotation stretch 6x5 seconds B  SKTC 5x5 second holds Piriformis stretches 3x30 seconds B figure 4 supine PPT x10 with 3 second holds  PPT + scap retraction into red TB x10 PPT + scap retraction flexion raise into red TB x10  QL 3x30 seconds  STS with red TB around knees x10 no UEs  Hip hikes against red TB x10  11/26/22  Nustep L7 x 6 min U/LE's  Chin tuck sitting 5 sec x 10 Lateral cervical flexion 3 sec x 5 Rt/Lt  Cervical rotation 2 sec x 5 Rt/Lt  Scap squeeze red TB 10 sec x 10 w/ noodle  W's red TB 10 sec x 10 w/ noodle  Row blue TB 5 sec x 20  Shoulder extension blue TB 5 sec x 10  Bow and arrow 3 sec x 10 Rt/Lt Antirotation blue TB 3 sec x 10 Rt/Lt  Diaphragmatic breathing count of 6 x 3 min  Doorway 3 positions 30 sec x 2 reps  Wall squat 10 sec x 10  Sit to stand, slow eccentric stand to sit x 10  Open book thoracic rotation Rt TB 5 sec x 10 Rt/Lt  Supine shoulder horizontal abduction red TB 3 sec x 10  Supine diagonal sword red TB 3 sec x 10 Rt/Lt  Cat cow stretch for T-spine x 20 Thoracic rotations in quadruped x 3 Rt/Lt  Trigger  Point Dry-Needling  Treatment instructions: Expect mild to moderate muscle soreness. S/S of pneumothorax if dry needled over a lung field, and to seek immediate medical attention should they occur. Patient verbalized understanding of these instructions and education.  Patient Consent Given: Yes Education handout provided: Yes Muscles treated: lower trap; medial scapular border Lt shoulder  Electrical stimulation performed: No Parameters: N/A Treatment response/outcome: decreased palpable tightness     TODAY'S TREATMENT:                                                                                                                              DATE:  11/21/22  Nustep L7 x 6 min U/LE's  Chin tuck sitting 5 sec x 10 Lateral cervical flexion 3 sec x 5 Rt/Lt  Cervical rotation 2 sec x 5 Rt/Lt  Scap squeeze red TB 10 sec x 10 w/ noodle  W's red TB 10 sec x 10 w/ noodle  Row blue TB 5 sec x 20  Shoulder extension blue TB 5 sec x 10  Bow and arrow 3 sec x 10 Rt/Lt Antirotation blue TB 3 sec x 10 Rt/Lt  Diaphragmatic breathing count of 6 x 3 min  Doorway 3 positions 30 sec x 2 reps  Wall squat 10 sec x 10  Sit to stand, slow eccentric stand to sit x 10  Open book thoracic rotation 10 sec x 10 Rt/Lt  Cat cow stretch for T-spine x 20 Thoracic rotations in quadruped x 3 Rt/Lt   PATIENT EDUCATION:  Education details: PT eval findings, anticipated POC, and initial HEP  Person educated: Patient Education method: Explanation, Demonstration, Verbal cues, and Handouts Education comprehension: verbalized understanding and returned demonstration  HOME EXERCISE PROGRAM:  Access Code: [email or web address] URL: [email or web address] Date: 11/26/2022 Prepared by: Corlis Leak  Exercises - Supine Hamstring Stretch with Strap  - 2 x daily - 7 x weekly - 2 sets - 3 reps - 30 sec  hold - Sidelying Thoracic Rotation with Open Book  - 1 x daily - 7 x weekly - 2 sets - 5 reps - Thoracic Extension  Mobilization with Noodle  - 1 x daily - 7 x weekly - 1 sets - 3 reps - 20 sec hold - Seated Cervical Sidebending Stretch  - 2 x daily - 7 x weekly - 1 sets - 3 reps - 30-60 sec hold - Seated Levator Scapulae Stretch  - 2 x daily - 7 x weekly - 1 sets - 3 reps - 30 sec hold - Seated Cervical Retraction  - 2 x daily - 7 x weekly - 1-2 sets - 5-10 reps - 10 sec  hold - Seated Cervical Sidebending AROM  - 2 x daily - 7 x weekly - 1 sets - 5 reps - 5-10 sec  hold - Seated Cervical Rotation AROM  - 2 x daily - 7 x weekly - 1 sets - 5 reps - 2-3 sec  hold - Shoulder External Rotation and Scapular Retraction with Resistance  - 2 x daily - 7 x weekly - 1 sets - 10 reps - 3-5 sec  hold - Shoulder W - External Rotation with Resistance  - 2 x daily - 7 x weekly - 1-2 sets - 10 reps - 3 sec  hold - Standing Bilateral Low Shoulder Row with Anchored Resistance  - 1 x daily - 7 x weekly - 1-3 sets - 10 reps - 2-3 sec  hold - Shoulder extension with resistance - Neutral  - 1 x daily - 7 x weekly - 1-2 sets - 10 reps - 3-5 sec  hold - Drawing Bow  - 1 x daily - 7 x weekly - 1 sets - 10 reps - 3 sec  hold - Anti-Rotation Lateral Stepping with Press  - 1 x daily - 7 x weekly - 1-2 sets - 10 reps - 2-3 sec  hold - Supine Diaphragmatic Breathing  - 2 x daily - 7 x weekly - 1 sets - 10 reps - 4-6 sec  hold - Doorway Pec Stretch at 60 Degrees Abduction  - 3 x daily - 7 x weekly - 1 sets - 3 reps - Doorway Pec Stretch at 90 Degrees Abduction  - 3 x daily - 7 x weekly - 1 sets - 3 reps - 30 seconds  hold - Doorway Pec Stretch at 120 Degrees Abduction  - 3 x daily - 7 x weekly - 1 sets - 3 reps - 30 second hold  hold - Wall Quarter Squat  - 2 x daily - 7 x weekly - 1-2 sets - 10 reps - 5-10 sec  hold - Sit to Stand  - 2 x daily - 7 x weekly - 1 sets - 10 reps - 3-5 sec  hold - Supine Shoulder Horizontal Abduction with Resistance  - 1 x daily - 7 x weekly - 1 sets - 10 reps - 3 sec  hold - Sidelying Shoulder Retraction with  Resistance  - 1 x daily - 7 x weekly - 1 sets - 10 reps - 3 sec  hold - Standing Shoulder Single Arm PNF D2 Flexion with Resistance  - 1 x daily - 7 x weekly - 1 sets - 10 reps - 3 sec  hold   ASSESSMENT: CLINICAL IMPRESSION:  Drue FlirtCandy returns with increased pain this morning, most of it in her low back- focused on lumbar interventions including lumbar mobility and core/hip strengthening today.  Spent a bit of time working on general biomechanics for various functional tasks as well. Will continue to progress as pain allows.  EVAL:Patient is a 57 y.o. female who was seen today for physical therapy evaluation and treatment for thoracic and low back pain and neck stiffness. She fell in November 2023 and fractured her R humerus and had subsequent ORIF. 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 She demonstrates decreased upper back and right UE strength and flexibility deficits in her cervical and thoracic spines as well as BLE. Pain is affecting her ability to do her job and also prevents her from sitting at home. She will benefit from skilled PT to address these deficits.    OBJECTIVE IMPAIRMENTS: decreased activity tolerance, decreased ROM, decreased strength, hypomobility, increased muscle spasms, impaired flexibility, improper body mechanics, and pain.     GOALS: Goals reviewed with patient? Yes  SHORT TERM GOALS: Target date: 12/05/2022   Patient will be independent with initial HEP.  Baseline:  Goal status: INITIAL  2.  Patient will report improvement in thoracic spine pain by 50% to improve ability to do job tasks.  Baseline:  Goal status: INITIAL   LONG TERM GOALS: Target date: 01/02/2023   Patient will be independent with advanced/ongoing HEP to improve outcomes and carryover.  Baseline:  Goal status: INITIAL  2.  Patient will report 85% improvement in thoracic and low back pain to improve QOL.  Baseline:  Goal status:  INITIAL  3.  Patient will demonstrate functional pain free cervical and  lumbar ROM to perform ADLs.   Baseline:  Goal status: INITIAL  4.  Patient will demonstrate improved mid back strength to 4+/5 to improve ability to carry her trays and perform work tasks. Baseline:  Goal status: INITIAL  5.  Patient will report 80 on thoracic FOTO to demonstrate improved functional ability.  Baseline: 46 Goal status: INITIAL    PLAN:  PT FREQUENCY: 1-2x/week  PT DURATION: 8 weeks  PLANNED INTERVENTIONS: Therapeutic exercises, Therapeutic activity, Neuromuscular re-education, Patient/Family education, Self Care, Joint mobilization, Dry Needling, Electrical stimulation, Spinal mobilization, Cryotherapy, Moist heat, Taping, Traction, Ultrasound, and Manual therapy  PLAN FOR NEXT SESSION: 4/16 is last scheduled session, reassess.Focus on mid/low traps, rhomboids strength, spinal stab, body mechanics for job specific tasks, Review and progress HEP for flexibility also. Caution with standing activities, seems to flare pain quickly.   Nedra Hai PT DPT PN2

## 2022-12-03 ENCOUNTER — Ambulatory Visit: Payer: BC Managed Care – PPO | Admitting: Rehabilitative and Restorative Service Providers"

## 2022-12-03 ENCOUNTER — Encounter: Payer: Self-pay | Admitting: Rehabilitative and Restorative Service Providers"

## 2022-12-03 DIAGNOSIS — M6281 Muscle weakness (generalized): Secondary | ICD-10-CM

## 2022-12-03 DIAGNOSIS — M546 Pain in thoracic spine: Secondary | ICD-10-CM | POA: Diagnosis not present

## 2022-12-03 DIAGNOSIS — M5459 Other low back pain: Secondary | ICD-10-CM

## 2022-12-03 DIAGNOSIS — M436 Torticollis: Secondary | ICD-10-CM

## 2022-12-03 NOTE — Therapy (Addendum)
 OUTPATIENT PHYSICAL THERAPY CERVICAL AND LUMBAR TREATMENT DISCHARGE SUMMARY  PHYSICAL THERAPY DISCHARGE SUMMARY  Visits from Start of Care: 7  Current functional level related to goals / functional outcomes: See progress note for discharge status   Remaining deficits: Unknown    Education / Equipment: HEP    Patient agrees to discharge. Patient goals were not met. Patient is being discharged due to not returning since the last visit.  Kellin Bartling P. Leonor Liv PT, MPH 10/23/23 2:06 PM     Patient Name: Veronika Heard MRN: [id number] DOB:[birth year], 57 y.o., female Today's Date: 12/03/2022  END OF SESSION:      Past Medical History:  Diagnosis Date   ADHD    Back pain    Restless leg    Past Surgical History:  Procedure Laterality Date   ABLATION     CHOLECYSTECTOMY     NASAL FRACTURE SURGERY     TUBAL LIGATION     Patient Active Problem List   Diagnosis Date Noted   DDD (degenerative disc disease), cervical 10/24/2022   Hyperlipidemia 08/06/2022   Encounter for chronic pain management 09/13/2021   Encounter for weight management 12/22/2020   Former smoker 03/07/2020   Chronic bilateral low back pain with right-sided sciatica 03/07/2020   IFG (impaired fasting glucose) 03/07/2020   History of depression 11/12/2019   Hot flashes 11/12/2019   Elevated LDL cholesterol level 11/17/2018   Chronic upper back pain 03/14/2014   Attention deficit hyperactivity disorder (ADHD) 03/30/2009   BREAST MASS, RIGHT 03/27/2009   RESTLESS LEG SYNDROME 12/14/2007   Other specified disease of hair and hair follicles 12/14/2007   GERD, SEVERE 01/27/2007   DERMATITIS, OTHER ATOPIC 01/27/2007   INSOMNIA 01/27/2007    PCP: Agapito Games, MD   REFERRING PROVIDER: Monica Becton,*   REFERRING DIAG: M50.30 (ICD-10-CM) - DDD (degenerative disc disease), cervical  And lumbar DDD  THERAPY DIAG:  Pain in thoracic spine  Other low back pain  Stiffness of cervical  spine  Muscle weakness (generalized)  Rationale for Evaluation and Treatment: Rehabilitation  ONSET DATE: Feb 2024  SUBJECTIVE:                                                                                                                                                                                                         SUBJECTIVE STATEMENT:  Increased LBP today. Sits on her bed at night and watches TV propped on pillows. Back pain is worse when patient is working and she has worked for more than 40 ours this week.  Eval:My back has been hurting for a long time. After breaking arm and returning to work her mid back started hurting more. Also has h/o of LBP which limits her from sitting on couch. She reports numbness in her left hand with driving - whole hand. I pop my neck a lot, it feels good. Patient denies neck pain. Hand dominance: Right  PERTINENT HISTORY:  07/04/22 ORIF R humerus, LBP, DDD neck and back  PAIN:  Are you having pain? Yes: NPRS scale: 5/10 (after pain meds this morning) Pain location: more in low back today  Pain description: tender, sharp nagging pain  Aggravating factors: unsure, maybe standing in one spot  Relieving factors: movement and stretching   PRECAUTIONS: None  WEIGHT BEARING RESTRICTIONS: No  FALLS:  Has patient fallen in last 6 months? Yes. Number of falls 1  LIVING ENVIRONMENT: Lives with: lives with their spouse Lives in: House/apartment Stairs: No Has following equipment at home: None  OCCUPATION: waitress at [employer]  PLOF: Independent  PATIENT GOALS: want my back to stop hurting  NEXT MD VISIT: 6 weeks  OBJECTIVE:   DIAGNOSTIC FINDINGS:  Mild degenerative changes in cervical 2024 and thoracic spine 2020.  Lumbar 2020: Progression in degenerative change in the facet joints with new anterolisthesis of L4 on L5 and L5 on S1.  PATIENT SURVEYS:  FOTO Thoracic 46 (predicted 57)  PALPATION: TTP in  gluteals   CERVICAL ROM:   Active ROM A/PROM (deg) eval  Flexion full  Extension full  Right lateral flexion 75%  Left lateral flexion 75%  Right rotation 75%  Left rotation 75%   (Blank rows = not tested)   Right side tighter  LUMBAR ROM: WNL  Left SB has decreased spinal curve vs. R  UPPER EXTREMITY ROM: WFL  UPPER EXTREMITY MMT: R shoulder flex, ABD 4+/5, else 5/5. L shoulder/elbow 5/5. R biceps 4+/5, triceps 5/5. Mid Traps R 3+ L 4+ Low Traps R 4- L 3+ Rhomboids R 4- L 4  LOWER EXTREMITY ROM: WFL  LOWER EXTREMITY MMT: Grossly 5/5 except bil hip flex 4+/5 and L hip ABD 4-/5  MUSCLE LENGTH: Tight R quads, piriformis; bil HS, UT, LS  SPINAL MOBILITY: decreased PA mobility in mid thoracic spine.  TODAY'S TREATMENT:                                                                                                                              DATE:    12/03/22:  Recumbent bike L3 x 8 min  Prone glut sets 10 sec x 10  Hip flexor stretch sitting 30 sec x 3 Rt/Lt  Hip flexor stretch Thomas position supine 30 sec x 2 Rt/Lt   Manual:  Sacral distraction pt prone  Deep tissue work through [employer] > Rt posterior hip IR/ER rotation by PT - pt in prone with direct pressure through greater trochanter  11/28/22  Nustep L7 x3 minutes/dropped to L6 x3 minutes due to LE  fatigue  not using UEs Lumbar rotation stretch 6x5 seconds B  SKTC 5x5 second holds Piriformis stretches 3x30 seconds B figure 4 supine PPT x10 with 3 second holds  PPT + scap retraction into red TB x10 PPT + scap retraction flexion raise into red TB x10  QL 3x30 seconds  STS with red TB around knees x10 no UEs  Hip hikes against red TB x10    11/26/22 (dry needling)  Nustep L7 x 6 min U/LE's  Chin tuck sitting 5 sec x 10 Lateral cervical flexion 3 sec x 5 Rt/Lt  Cervical rotation 2 sec x 5 Rt/Lt  Scap squeeze red TB 10 sec x 10 w/ noodle  W's red TB 10 sec x 10 w/ noodle  Row blue TB 5 sec x 20  Shoulder  extension blue TB 5 sec x 10  Bow and arrow 3 sec x 10 Rt/Lt Antirotation blue TB 3 sec x 10 Rt/Lt  Diaphragmatic breathing count of 6 x 3 min  Doorway 3 positions 30 sec x 2 reps  Wall squat 10 sec x 10  Sit to stand, slow eccentric stand to sit x 10  Open book thoracic rotation Rt TB 5 sec x 10 Rt/Lt  Supine shoulder horizontal abduction red TB 3 sec x 10  Supine diagonal sword red TB 3 sec x 10 Rt/Lt  Cat cow stretch for T-spine x 20 Thoracic rotations in quadruped x 3 Rt/Lt  Trigger Point Dry-Needling  Treatment instructions: Expect mild to moderate muscle soreness. S/S of pneumothorax if dry needled over a lung field, and to seek immediate medical attention should they occur. Patient verbalized understanding of these instructions and education.  Patient Consent Given: Yes Education handout provided: Yes Muscles treated: lower trap; medial scapular border Lt shoulder  Electrical stimulation performed: No Parameters: N/A Treatment response/outcome: decreased palpable tightness    PATIENT EDUCATION:  Education details: PT eval findings, anticipated POC, and initial HEP  Person educated: Patient Education method: Explanation, Demonstration, Verbal cues, and Handouts Education comprehension: verbalized understanding and returned demonstration  HOME EXERCISE PROGRAM:   Access Code: [email or web address] URL: [email or web address] Date: 12/03/2022 Prepared by: Corlis Leak  Exercises - Supine Hamstring Stretch with Strap  - 2 x daily - 7 x weekly - 2 sets - 3 reps - 30 sec hold - Sidelying Thoracic Rotation with Open Book  - 1 x daily - 7 x weekly - 2 sets - 5 reps - Thoracic Extension Mobilization with Noodle  - 1 x daily - 7 x weekly - 1 sets - 3 reps - 20 sec hold - Seated Cervical Sidebending Stretch  - 2 x daily - 7 x weekly - 1 sets - 3 reps - 30-60 sec hold - Seated Levator Scapulae Stretch  - 2 x daily - 7 x weekly - 1 sets - 3 reps - 30 sec hold - Seated Cervical  Retraction  - 2 x daily - 7 x weekly - 1-2 sets - 5-10 reps - 10 sec  hold - Seated Cervical Sidebending AROM  - 2 x daily - 7 x weekly - 1 sets - 5 reps - 5-10 sec  hold - Seated Cervical Rotation AROM  - 2 x daily - 7 x weekly - 1 sets - 5 reps - 2-3 sec  hold - Shoulder External Rotation and Scapular Retraction with Resistance  - 2 x daily - 7 x weekly - 1 sets - 10 reps - 3-5 sec  hold - Shoulder W -  External Rotation with Resistance  - 2 x daily - 7 x weekly - 1-2 sets - 10 reps - 3 sec  hold - Standing Bilateral Low Shoulder Row with Anchored Resistance  - 1 x daily - 7 x weekly - 1-3 sets - 10 reps - 2-3 sec  hold - Shoulder extension with resistance - Neutral  - 1 x daily - 7 x weekly - 1-2 sets - 10 reps - 3-5 sec  hold - Drawing Bow  - 1 x daily - 7 x weekly - 1 sets - 10 reps - 3 sec  hold - Anti-Rotation Lateral Stepping with Press  - 1 x daily - 7 x weekly - 1-2 sets - 10 reps - 2-3 sec  hold - Supine Diaphragmatic Breathing  - 2 x daily - 7 x weekly - 1 sets - 10 reps - 4-6 sec  hold - Doorway Pec Stretch at 60 Degrees Abduction  - 3 x daily - 7 x weekly - 1 sets - 3 reps - Doorway Pec Stretch at 90 Degrees Abduction  - 3 x daily - 7 x weekly - 1 sets - 3 reps - 30 seconds  hold - Doorway Pec Stretch at 120 Degrees Abduction  - 3 x daily - 7 x weekly - 1 sets - 3 reps - 30 second hold  hold - Wall Quarter Squat  - 2 x daily - 7 x weekly - 1-2 sets - 10 reps - 5-10 sec  hold - Sit to Stand  - 2 x daily - 7 x weekly - 1 sets - 10 reps - 3-5 sec  hold - Supine Shoulder Horizontal Abduction with Resistance  - 1 x daily - 7 x weekly - 1 sets - 10 reps - 3 sec  hold - Sidelying Shoulder Retraction with Resistance  - 1 x daily - 7 x weekly - 1 sets - 10 reps - 3 sec  hold - Standing Shoulder Single Arm PNF D2 Flexion with Resistance  - 1 x daily - 7 x weekly - 1 sets - 10 reps - 3 sec  hold - Seated Hip Flexor Stretch  - 2 x daily - 7 x weekly - 1 sets - 3 reps - 30 sec  hold - Hip Flexor  Stretch at Edge of Bed  - 2 x daily - 7 x weekly - 1 sets - 3 reps - 30 sec  hold   ASSESSMENT: CLINICAL IMPRESSION:     EVAL:Patient is a 57 y.o. female who was seen today for physical therapy evaluation and treatment for thoracic and low back pain and neck stiffness. She fell in November 2023 and fractured her R humerus and had subsequent ORIF. 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 She demonstrates decreased upper back and right UE strength and flexibility deficits in her cervical and thoracic spines as well as BLE. Pain is affecting her ability to do her job and also prevents her from sitting at home. She will benefit from skilled PT to address these deficits.    OBJECTIVE IMPAIRMENTS: decreased activity tolerance, decreased ROM, decreased strength, hypomobility, increased muscle spasms, impaired flexibility, improper body mechanics, and pain.     GOALS: Goals reviewed with patient? Yes  SHORT TERM GOALS: Target date: 12/05/2022   Patient will be independent with initial HEP.  Baseline:  Goal status: INITIAL  2.  Patient will report improvement in thoracic spine pain by  50% to improve ability to do job tasks.  Baseline:  Goal status: INITIAL   LONG TERM GOALS: Target date: 01/02/2023   Patient will be independent with advanced/ongoing HEP to improve outcomes and carryover.  Baseline:  Goal status: INITIAL  2.  Patient will report 85% improvement in thoracic and low back pain to improve QOL.  Baseline:  Goal status: INITIAL  3.  Patient will demonstrate functional pain free cervical and  lumbar ROM to perform ADLs.   Baseline:  Goal status: INITIAL  4.  Patient will demonstrate improved mid back strength to 4+/5 to improve ability to carry her trays and perform work tasks. Baseline:  Goal status: INITIAL  5.  Patient will report 29 on thoracic FOTO to demonstrate improved functional ability.  Baseline: 46 Goal  status: INITIAL    PLAN:  PT FREQUENCY: 1-2x/week  PT DURATION: 8 weeks  PLANNED INTERVENTIONS: Therapeutic exercises, Therapeutic activity, Neuromuscular re-education, Patient/Family education, Self Care, Joint mobilization, Dry Needling, Electrical stimulation, Spinal mobilization, Cryotherapy, Moist heat, Taping, Traction, Ultrasound, and Manual therapy  PLAN FOR NEXT SESSION: 4/16 is last scheduled session, reassess.Focus on mid/low traps, rhomboids strength, spinal stab, body mechanics for job specific tasks, Review and progress HEP for flexibility also. Caution with standing activities, seems to flare pain quickly. Note to MD.   Antuane Eastridge P. Leonor Liv PT, MPH 12/03/22 3:03 PM

## 2022-12-05 ENCOUNTER — Ambulatory Visit: Payer: BC Managed Care – PPO | Admitting: Sports Medicine

## 2022-12-05 DIAGNOSIS — M5441 Lumbago with sciatica, right side: Secondary | ICD-10-CM | POA: Diagnosis not present

## 2022-12-05 DIAGNOSIS — G8929 Other chronic pain: Secondary | ICD-10-CM | POA: Diagnosis not present

## 2022-12-05 DIAGNOSIS — M5030 Other cervical disc degeneration, unspecified cervical region: Secondary | ICD-10-CM

## 2022-12-05 DIAGNOSIS — M542 Cervicalgia: Secondary | ICD-10-CM

## 2022-12-05 MED ORDER — TRIAZOLAM 0.25 MG PO TABS: ORAL_TABLET | ORAL | 0 refills | Status: DC

## 2022-12-05 NOTE — Assessment & Plan Note (Addendum)
Right-sided axial low back pain, failed 6 weeks of physical therapy, naproxen, steroids, she does have some L4-L5 spondylolisthesis, suspect she will be getting a right-sided lumbar epidural. She would like to have me go ahead and order her injection but also go over the images in person.

## 2022-12-05 NOTE — Assessment & Plan Note (Signed)
Known cervical DDD, greater than 6 weeks of conservative treatment have been failed including formal physical therapy, naproxen, steroids, activity modification, x-rays with degenerative changes, proceeding with MRI, expect epidural as she is having left-sided periscapular pain with radiation down the left arm.

## 2022-12-05 NOTE — Progress Notes (Signed)
    Procedures performed today:    None.  Independent interpretation of notes and tests performed by another provider:   None.  Brief History, Exam, Impression, and Recommendations:    DDD (degenerative disc disease), cervical Known cervical DDD, greater than 6 weeks of conservative treatment have been failed including formal physical therapy, naproxen, steroids, activity modification, x-rays with degenerative changes, proceeding with MRI, expect epidural as she is having left-sided periscapular pain with radiation down the left arm.  Chronic bilateral low back pain with right-sided sciatica Right-sided axial low back pain, failed 6 weeks of physical therapy, naproxen, steroids, she does have some L4-L5 spondylolisthesis, suspect she will be getting a right-sided lumbar epidural. She would like to have me go ahead and order her injection but also go over the images in person.    ____________________________________________ Ihor Austin. Benjamin Stain, M.D., ABFM., CAQSM., AME. Primary Care and Sports Medicine [facility]  Adjunct Professor of Family Medicine  [place] [employer] [facility] [employer]  Restaurant manager, fast food

## 2022-12-09 ENCOUNTER — Telehealth: Payer: Self-pay | Admitting: Family Medicine

## 2022-12-09 DIAGNOSIS — M5030 Other cervical disc degeneration, unspecified cervical region: Secondary | ICD-10-CM

## 2022-12-09 MED ORDER — TRIAZOLAM 0.25 MG PO TABS: ORAL_TABLET | ORAL | 0 refills | Status: DC

## 2022-12-09 NOTE — Telephone Encounter (Signed)
Done

## 2022-12-09 NOTE — Telephone Encounter (Signed)
Pt called.  Triazolam called into [facility] should've been called into [facility].

## 2022-12-25 ENCOUNTER — Other Ambulatory Visit: Payer: Self-pay | Admitting: Family Medicine

## 2023-01-02 ENCOUNTER — Encounter: Payer: Self-pay | Admitting: Sports Medicine

## 2023-01-03 ENCOUNTER — Telehealth: Payer: BC Managed Care – PPO | Admitting: Sports Medicine

## 2023-01-03 ENCOUNTER — Encounter: Payer: Self-pay | Admitting: Sports Medicine

## 2023-01-03 VITALS — Temp 98.6°F | Wt 191.0 lb

## 2023-01-03 DIAGNOSIS — M5441 Lumbago with sciatica, right side: Secondary | ICD-10-CM

## 2023-01-03 DIAGNOSIS — M5030 Other cervical disc degeneration, unspecified cervical region: Secondary | ICD-10-CM

## 2023-01-03 DIAGNOSIS — G8929 Other chronic pain: Secondary | ICD-10-CM

## 2023-01-03 NOTE — Assessment & Plan Note (Signed)
Known cervical DDD, multiple levels, left periscapular symptoms, proceeding with left C6-C7 interlaminar epidural

## 2023-01-03 NOTE — Progress Notes (Signed)
   Virtual Visit via WebEx/MyChart   I connected with  Kelli Tyler  on 01/03/23 via WebEx/MyChart/Doximity Video and verified that I am speaking with the correct person using two identifiers.   I discussed the limitations, risks, security and privacy concerns of performing an evaluation and management service by WebEx/MyChart/Doximity Video, including the higher likelihood of inaccurate diagnosis and treatment, and the availability of in person appointments.  We also discussed the likely need of an additional face to face encounter for complete and high quality delivery of care.  I also discussed with the patient that there may be a patient responsible charge related to this service. The patient expressed understanding and wishes to proceed.  Provider location is in medical facility. Patient location is at their home, different from provider location. People involved in care of the patient during this telehealth encounter were myself, my nurse/medical assistant, and my front office/scheduling team member.  Review of Systems: No fevers, chills, night sweats, weight loss, chest pain, or shortness of breath.   Objective Findings:    General: Speaking full sentences, no audible heavy breathing.  Sounds alert and appropriately interactive.  Appears well.  Face symmetric.  Extraocular movements intact.  Pupils equal and round.  No nasal flaring or accessory muscle use visualized.  Independent interpretation of tests performed by another provider:   None.  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   I discussed the above assessment and treatment plan with the  patient. The patient was provided an opportunity to ask questions and all were answered. The patient agreed with the plan and demonstrated an understanding of the instructions.   The patient was advised to call back or seek an in-person evaluation if the symptoms worsen or if the condition fails to improve as anticipated.   I was late for this appointment so there will be no charge   ____________________________________________ Ihor Austin. Benjamin Stain, M.D., ABFM., CAQSM., AME. Primary Care and Sports Medicine [facility]  Adjunct Professor of Family Medicine  [place] [employer] [facility] [employer]  Restaurant manager, fast food

## 2023-01-03 NOTE — Assessment & Plan Note (Signed)
Multilevel lumbar DDD, L4-L5 spondylolisthesis, proceeding with right-sided L4-L5 interlaminar epidural.

## 2023-01-23 ENCOUNTER — Ambulatory Visit
Admission: RE | Admit: 2023-01-23 | Discharge: 2023-01-23 | Disposition: A | Discharge status: Home / Self Care | Payer: BC Managed Care – PPO | Source: Ambulatory Visit | Attending: Sports Medicine | Admitting: Sports Medicine

## 2023-01-23 DIAGNOSIS — G8929 Other chronic pain: Secondary | ICD-10-CM

## 2023-01-23 MED ORDER — METHYLPREDNISOLONE ACETATE 40 MG/ML INJ SUSP (RADIOLOG
80.0000 mg | Freq: Once | INTRAMUSCULAR | Status: AC
  Administered 2023-01-23: 80 mg via EPIDURAL

## 2023-01-23 MED ORDER — IOPAMIDOL (ISOVUE-M 200) INJECTION 41%
1.0000 mL | Freq: Once | INTRAMUSCULAR | Status: AC
  Administered 2023-01-23: 1 mL via EPIDURAL

## 2023-01-23 NOTE — Discharge Instructions (Signed)

## 2023-01-28 ENCOUNTER — Other Ambulatory Visit: Payer: Self-pay | Admitting: Family Medicine

## 2023-02-11 ENCOUNTER — Ambulatory Visit
Admission: RE | Admit: 2023-02-11 | Discharge: 2023-02-11 | Disposition: A | Discharge status: Home / Self Care | Payer: BC Managed Care – PPO | Source: Ambulatory Visit | Attending: Sports Medicine | Admitting: Sports Medicine

## 2023-02-11 DIAGNOSIS — M5030 Other cervical disc degeneration, unspecified cervical region: Secondary | ICD-10-CM

## 2023-02-11 MED ORDER — TRIAMCINOLONE ACETONIDE 40 MG/ML IJ SUSP (RADIOLOGY)
60.0000 mg | Freq: Once | INTRAMUSCULAR | Status: AC
  Administered 2023-02-11: 60 mg via EPIDURAL

## 2023-02-11 MED ORDER — IOPAMIDOL (ISOVUE-M 300) INJECTION 61%
1.0000 mL | Freq: Once | INTRAMUSCULAR | Status: AC | PRN
  Administered 2023-02-11: 1 mL via EPIDURAL

## 2023-02-11 NOTE — Discharge Instructions (Signed)

## 2023-02-15 ENCOUNTER — Other Ambulatory Visit: Payer: Self-pay | Admitting: Sports Medicine

## 2023-02-15 DIAGNOSIS — M5030 Other cervical disc degeneration, unspecified cervical region: Secondary | ICD-10-CM

## 2023-02-21 ENCOUNTER — Other Ambulatory Visit: Payer: Self-pay | Admitting: Family Medicine

## 2023-02-21 DIAGNOSIS — G8929 Other chronic pain: Secondary | ICD-10-CM

## 2023-03-02 ENCOUNTER — Other Ambulatory Visit: Payer: Self-pay | Admitting: Sports Medicine

## 2023-04-01 ENCOUNTER — Ambulatory Visit: Payer: BC Managed Care – PPO | Admitting: Sports Medicine

## 2023-04-01 DIAGNOSIS — G8929 Other chronic pain: Secondary | ICD-10-CM

## 2023-04-01 DIAGNOSIS — M5030 Other cervical disc degeneration, unspecified cervical region: Secondary | ICD-10-CM | POA: Diagnosis not present

## 2023-04-01 DIAGNOSIS — M5441 Lumbago with sciatica, right side: Secondary | ICD-10-CM | POA: Diagnosis not present

## 2023-04-01 MED ORDER — TRAMADOL HCL 50 MG PO TABS
50.0000 mg | ORAL_TABLET | Freq: Three times a day (TID) | ORAL | 3 refills | Status: DC | PRN
Start: 2023-04-01 — End: 2023-10-08

## 2023-04-01 MED ORDER — PREGABALIN 50 MG PO CAPS
ORAL_CAPSULE | ORAL | 3 refills | Status: DC
Start: 2023-04-01 — End: 2023-05-01

## 2023-04-01 MED ORDER — NAPROXEN 500 MG PO TABS: 500.0000 mg | ORAL_TABLET | Freq: Two times a day (BID) | ORAL | 3 refills | Status: DC

## 2023-04-01 NOTE — Progress Notes (Signed)
    Procedures performed today:    None.  Independent interpretation of notes and tests performed by another provider:   None.  Brief History, Exam, Impression, and Recommendations:    Chronic bilateral low back pain with right-sided sciatica This is a very pleasant 57 year old female, she has known multilevel lumbar degenerative disc disease, she has L4-L5 spondylolisthesis, after failure of conservative treatment we proceeded with a right L4-L5 interlaminar epidural injection. She got 50% relief, still has some discomfort, we will take more of a medical approach as opposed to proceeding with additional intervention. We will refill her tramadol, naproxen, and start a Lyrica up taper, she did not tolerate gabapentin.  DDD (degenerative disc disease), cervical Agata also has known cervical degenerative disc disease at multiple levels with left-sided periscapular symptoms, she had about 30% relief from a left C6-C7 interlaminar epidural, she is a candidate for epidural #2, we can do 3 and a 14-month period of time. In the meantime we will take more of a pharmacologic approach, adding Lyrica, refilling tramadol, naproxen.    ____________________________________________ Ihor Austin. Benjamin Stain, M.D., ABFM., CAQSM., AME. Primary Care and Sports Medicine [facility]  Adjunct Professor of Family Medicine  [place] [employer] [facility] [employer]  Restaurant manager, fast food

## 2023-04-01 NOTE — Assessment & Plan Note (Signed)
This is a very pleasant 57 year old female, she has known multilevel lumbar degenerative disc disease, she has L4-L5 spondylolisthesis, after failure of conservative treatment we proceeded with a right L4-L5 interlaminar epidural injection. She got 50% relief, still has some discomfort, we will take more of a medical approach as opposed to proceeding with additional intervention. We will refill her tramadol, naproxen, and start a Lyrica up taper, she did not tolerate gabapentin.

## 2023-04-01 NOTE — Assessment & Plan Note (Signed)
Kelli Tyler also has known cervical degenerative disc disease at multiple levels with left-sided periscapular symptoms, she had about 30% relief from a left C6-C7 interlaminar epidural, she is a candidate for epidural #2, we can do 3 and a 32-month period of time. In the meantime we will take more of a pharmacologic approach, adding Lyrica, refilling tramadol, naproxen.

## 2023-04-29 ENCOUNTER — Ambulatory Visit: Payer: BC Managed Care – PPO | Admitting: Family Medicine

## 2023-04-29 ENCOUNTER — Encounter: Payer: Self-pay | Admitting: Family Medicine

## 2023-04-29 VITALS — BP 137/70 | HR 79 | Ht 64.0 in | Wt 211.0 lb

## 2023-04-29 DIAGNOSIS — Z7689 Persons encountering health services in other specified circumstances: Secondary | ICD-10-CM | POA: Diagnosis not present

## 2023-04-29 DIAGNOSIS — L821 Other seborrheic keratosis: Secondary | ICD-10-CM

## 2023-04-29 DIAGNOSIS — R7301 Impaired fasting glucose: Secondary | ICD-10-CM

## 2023-04-29 DIAGNOSIS — G8929 Other chronic pain: Secondary | ICD-10-CM

## 2023-04-29 DIAGNOSIS — M5441 Lumbago with sciatica, right side: Secondary | ICD-10-CM

## 2023-04-29 DIAGNOSIS — R200 Anesthesia of skin: Secondary | ICD-10-CM

## 2023-04-29 LAB — POCT GLYCOSYLATED HEMOGLOBIN (HGB A1C): Hemoglobin A1C: 5.5 % (ref 4.0–5.6)

## 2023-04-29 NOTE — Patient Instructions (Addendum)
Please check with your insurance to see if cover Arville Lime, Zepbound, [place].

## 2023-04-29 NOTE — Assessment & Plan Note (Signed)
Well controlled. Looks awesome!!!!!  Continue current regimen. Follow up in  6 mo   Lab Results  Component Value Date   HGBA1C 5.5 04/29/2023

## 2023-04-29 NOTE — Assessment & Plan Note (Signed)
Don't think the Lyrica is providing a lot of relief and is causing excess sedation. Would d/c and discuss with Dr. Karie Schwalbe at follow up.

## 2023-04-29 NOTE — Assessment & Plan Note (Addendum)
Discussed options.  You can check with your insurance to see if they will cover zepbound, Wegovy, Saxenda, Qsymia, etc.   Also consider Healthy Weight and Wellness.  Work on stretches and increasing movement.

## 2023-04-29 NOTE — Progress Notes (Signed)
Established Patient Office Visit  Subjective   Patient ID: Kelli Tyler, female    DOB: 1965-09-07  Age: 57 y.o. MRN: [id number]  Chief Complaint  Patient presents with   Nevus    Pt has mole on her back that she reports has gotten darker and larger.     HPI  Impaired fasting glucose-no increased thirst or urination. No symptoms consistent with hypoglycemia.  F/U chronic pain - currently on Cymbalta, Tramadol, and LYrica.  Sees sports med.  She says the [place] make her really tired so only taking at bedtime. Also made her feel bloated.    She is frustrated with her weight gain. She was doing well with weight loss until her back starting flaring. She has been doing PT and seeins sports med. Hasn't been able to be as active.   Flowsheet Row Office Visit from 04/29/2023 in [facility] at [facility]  PHQ-9 Total Score 3        Skin lesion on her back. Thinks getting larger.   Also c/o of bilateral thigh numbness and burning sensation aver groin crease area x 2 years.      ROS    Objective:     BP 137/70   Pulse 79   Ht 5\' 4"  (1.626 m)   Wt 211 lb (95.7 kg)   LMP 08/07/2015   SpO2 97%   BMI 36.22 kg/m    Physical Exam Vitals and nursing note reviewed.  Constitutional:      Appearance: Normal appearance.  HENT:     Head: Normocephalic and atraumatic.  Eyes:     Conjunctiva/sclera: Conjunctivae normal.  Cardiovascular:     Rate and Rhythm: Normal rate and regular rhythm.  Pulmonary:     Effort: Pulmonary effort is normal.     Breath sounds: Normal breath sounds.  Skin:    General: Skin is warm and dry.     Comments: Right upper back with light brown oval shaped seborrheic keratosis  Neurological:     Mental Status: She is alert.  Psychiatric:        Mood and Affect: Mood normal.      Results for orders placed or performed in visit on 04/29/23  POCT HgB A1C  Result Value Ref Range   Hemoglobin A1C 5.5 4.0  - 5.6 %   HbA1c POC (<> result, manual entry)     HbA1c, POC (prediabetic range)     HbA1c, POC (controlled diabetic range)        The 10-year ASCVD risk score (Arnett DK, et al., 2019) is: 1.9%    Assessment & Plan:   Problem List Items Addressed This Visit       Endocrine   IFG (impaired fasting glucose) - Primary    Well controlled. Looks awesome!!!!!  Continue current regimen. Follow up in  6 mo   Lab Results  Component Value Date   HGBA1C 5.5 04/29/2023         Relevant Orders   POCT HgB A1C (Completed)     Nervous and Auditory   Chronic bilateral low back pain with right-sided sciatica    Don't think the Lyrica is providing a lot of relief and is causing excess sedation. Would d/c and discuss with Dr. Karie Schwalbe at follow up.         Other   Encounter for weight management    Discussed options.  You can check with your insurance to see if they will  cover zepbound, Wegovy, Saxenda, Qsymia, etc.   Also consider Healthy Weight and Wellness.  Work on stretches and increasing movement.       Other Visit Diagnoses     Seborrheic keratoses       Anterior thigh numbness       Relevant Orders   Ambulatory referral to Neurology       Seb K - discussed benign nature of lesion. If becomes irritated from bra strap can be treated with cryotherapy.   Bilat thigh numbness/paresthesias. Refer to Neurology for further workup/possible nerve conduction. Could be from her back.   Return in about 6 months (around 10/27/2023).    Nani Gasser, MD

## 2023-05-01 ENCOUNTER — Encounter: Payer: Self-pay | Admitting: Sports Medicine

## 2023-05-01 ENCOUNTER — Ambulatory Visit: Payer: BC Managed Care – PPO | Admitting: Sports Medicine

## 2023-05-01 DIAGNOSIS — G8929 Other chronic pain: Secondary | ICD-10-CM | POA: Diagnosis not present

## 2023-05-01 DIAGNOSIS — M5441 Lumbago with sciatica, right side: Secondary | ICD-10-CM | POA: Diagnosis not present

## 2023-05-01 DIAGNOSIS — M5030 Other cervical disc degeneration, unspecified cervical region: Secondary | ICD-10-CM | POA: Diagnosis not present

## 2023-05-01 NOTE — Progress Notes (Signed)
    Procedures performed today:    None.  Independent interpretation of notes and tests performed by another provider:   None.  Brief History, Exam, Impression, and Recommendations:    Chronic bilateral low back pain with right-sided sciatica Shimeka returns, she is a pleasant 57 year old female, chronic axial low back pain with multilevel degenerative disc disease, she does have grade 1 anterolisthesis L4-5 and L5-S1, she also has bilateral L4 and L5 nerve root impingement. She has had a single L4-L5 interlaminar epidural with maybe 3 weeks of improvement. She did not tolerate Lyrica. Ultimately naproxen 1-2 times a day and tramadol tends to control her pain, she is also taking Cymbalta. She does continue to prefer more of a medical approach. She is requesting nerve conduction and EMG, I think this is appropriate, this has already been ordered by her PCP. If she does not hear back from a neurologist in [facility] we will use [facility], they typically get the nerve conduction study and results back very quickly. We will continue naproxen twice a day, tramadol up to 3 times a day and Cymbalta for now. She is not interested in surgical consultation. Based on the nerve conduction and EMG results we may consider repeat epidural, potentially bilateral L4 or bilateral L5 nerve root blocks.  DDD (degenerative disc disease), cervical As above continued pain, epidurals are really not tremendously effective, she has already had 1 left C6-C7 interlaminar epidural. Did not tolerate Lyrica, symptoms are moderately well-controlled with naproxen, tramadol, Cymbalta. Declines surgical consultation.    ____________________________________________ Ihor Austin. Benjamin Stain, M.D., ABFM., CAQSM., AME. Primary Care and Sports Medicine [facility]  Adjunct Professor of Family Medicine  [employer] [facility] [employer] [employer] [facility] [employer]  Stage manager

## 2023-05-01 NOTE — Assessment & Plan Note (Signed)
Kelli Tyler returns, she is a pleasant 57 year old female, chronic axial low back pain with multilevel degenerative disc disease, she does have grade 1 anterolisthesis L4-5 and L5-S1, she also has bilateral L4 and L5 nerve root impingement. She has had a single L4-L5 interlaminar epidural with maybe 3 weeks of improvement. She did not tolerate Lyrica. Ultimately naproxen 1-2 times a day and tramadol tends to control her pain, she is also taking Cymbalta. She does continue to prefer more of a medical approach. She is requesting nerve conduction and EMG, I think this is appropriate, this has already been ordered by her PCP. If she does not hear back from a neurologist in [facility] we will use [facility], they typically get the nerve conduction study and results back very quickly. We will continue naproxen twice a day, tramadol up to 3 times a day and Cymbalta for now. She is not interested in surgical consultation. Based on the nerve conduction and EMG results we may consider repeat epidural, potentially bilateral L4 or bilateral L5 nerve root blocks.

## 2023-05-01 NOTE — Assessment & Plan Note (Signed)
As above continued pain, epidurals are really not tremendously effective, she has already had 1 left C6-C7 interlaminar epidural. Did not tolerate Lyrica, symptoms are moderately well-controlled with naproxen, tramadol, Cymbalta. Declines surgical consultation.

## 2023-08-04 ENCOUNTER — Other Ambulatory Visit: Payer: Self-pay | Admitting: Family Medicine

## 2023-08-04 DIAGNOSIS — Z1231 Encounter for screening mammogram for malignant neoplasm of breast: Secondary | ICD-10-CM

## 2023-08-20 ENCOUNTER — Other Ambulatory Visit: Payer: Self-pay | Admitting: Family Medicine

## 2023-08-20 DIAGNOSIS — G8929 Other chronic pain: Secondary | ICD-10-CM

## 2023-08-25 ENCOUNTER — Telehealth: Payer: BC Managed Care – PPO | Admitting: Sports Medicine

## 2023-08-25 DIAGNOSIS — G8929 Other chronic pain: Secondary | ICD-10-CM | POA: Diagnosis not present

## 2023-08-25 DIAGNOSIS — M5441 Lumbago with sciatica, right side: Secondary | ICD-10-CM

## 2023-08-25 NOTE — Assessment & Plan Note (Signed)
 This very pleasant 57 year old female returns, she has chronic axial low back pain with multilevel DDD, grade 1 anterolisthesis L4-L5 and L5-S1 with bilateral L4 and L5 nerve root impingement, a single L4-L5 interlaminar epidural provided maybe 3 weeks of improvement, she did not tolerate Lyrica , ultimately naproxen  1-2 times a day and tramadol  has controlled her pain, Cymbalta  also helpful. We did add a nerve conduction and EMG, results are available in Care Everywhere, this was negative. EMG nerve conduction studies can miss up to 20% of radiculopathy. As her MRI did show potential right L5 nerve root impingement as well we will consider this for a future injection although her symptoms are well-controlled with the current medications and we can refill the tramadol  as needed.

## 2023-08-25 NOTE — Progress Notes (Signed)
   Virtual Visit via WebEx/MyChart   I connected with  Kelli Tyler  on 08/25/23 via WebEx/MyChart/Doximity Video and verified that I am speaking with the correct person using two identifiers.   I discussed the limitations, risks, security and privacy concerns of performing an evaluation and management service by WebEx/MyChart/Doximity Video, including the higher likelihood of inaccurate diagnosis and treatment, and the availability of in person appointments.  We also discussed the likely need of an additional face to face encounter for complete and high quality delivery of care.  I also discussed with the patient that there may be a patient responsible charge related to this service. The patient expressed understanding and wishes to proceed.  Provider location is in medical facility. Patient location is at their home, different from provider location. People involved in care of the patient during this telehealth encounter were myself, my nurse/medical assistant, and my front office/scheduling team member.  Review of Systems: No fevers, chills, night sweats, weight loss, chest pain, or shortness of breath.   Objective Findings:    General: Speaking full sentences, no audible heavy breathing.  Sounds alert and appropriately interactive.  Appears well.  Face symmetric.  Extraocular movements intact.  Pupils equal and round.  No nasal flaring or accessory muscle use visualized.  Independent interpretation of tests performed by another provider:   None.  Brief History, Exam, Impression, and Recommendations:    Chronic bilateral low back pain with right-sided sciatica This very pleasant 58 year old female returns, she has chronic axial low back pain with multilevel DDD, grade 1 anterolisthesis L4-L5 and L5-S1 with bilateral L4 and L5 nerve root impingement, a single L4-L5 interlaminar epidural provided maybe 3 weeks of improvement, she did not tolerate Lyrica , ultimately naproxen  1-2 times a day  and tramadol  has controlled her pain, Cymbalta  also helpful. We did add a nerve conduction and EMG, results are available in Care Everywhere, this was negative. EMG nerve conduction studies can miss up to 20% of radiculopathy. As her MRI did show potential right L5 nerve root impingement as well we will consider this for a future injection although her symptoms are well-controlled with the current medications and we can refill the tramadol  as needed.   I discussed the above assessment and treatment plan with the patient. The patient was provided an opportunity to ask questions and all were answered. The patient agreed with the plan and demonstrated an understanding of the instructions.   The patient was advised to call back or seek an in-person evaluation if the symptoms worsen or if the condition fails to improve as anticipated.   I provided 15 minutes of face to face and non-face-to-face time during this encounter date, time was needed to gather information, review chart, records, communicate/coordinate with staff remotely, as well as complete documentation.   ____________________________________________ Kelli Tyler. Curtis, M.D., ABFM., CAQSM., AME. Primary Care and Sports Medicine [facility]  Adjunct Professor of [facility] Medicine  [employer]  Restaurant Manager, [employer]

## 2023-09-04 ENCOUNTER — Encounter: Payer: Self-pay | Admitting: Family Medicine

## 2023-09-04 ENCOUNTER — Ambulatory Visit: Payer: BC Managed Care – PPO | Admitting: Family Medicine

## 2023-09-04 VITALS — BP 128/75 | HR 66 | Ht 64.0 in | Wt 195.0 lb

## 2023-09-04 DIAGNOSIS — M5441 Lumbago with sciatica, right side: Secondary | ICD-10-CM | POA: Diagnosis not present

## 2023-09-04 DIAGNOSIS — G8929 Other chronic pain: Secondary | ICD-10-CM | POA: Diagnosis not present

## 2023-09-04 DIAGNOSIS — Z0000 Encounter for general adult medical examination without abnormal findings: Secondary | ICD-10-CM | POA: Diagnosis not present

## 2023-09-04 DIAGNOSIS — Z23 Encounter for immunization: Secondary | ICD-10-CM | POA: Diagnosis not present

## 2023-09-04 DIAGNOSIS — R7301 Impaired fasting glucose: Secondary | ICD-10-CM | POA: Diagnosis not present

## 2023-09-04 NOTE — Progress Notes (Signed)
   Established Patient Office Visit  Subjective  Patient ID: Kelli Tyler, female    DOB: 1965-09-30  Age: 58 y.o. MRN: [id number]  Chief Complaint  Patient presents with   Annual Exam    HPI  Impaired fasting glucose-no increased thirst or urination. No symptoms consistent with hypoglycemia.  Chronic low back pain - on cymbalta .     ROS    Objective:     BP 128/75   Pulse 66   Ht 5\' 4"  (1.626 m)   Wt 195 lb (88.5 kg)   LMP 08/07/2015   SpO2 99%   BMI 33.47 kg/m    Physical Exam Vitals and nursing note reviewed. Exam conducted with a chaperone present.  Constitutional:      Appearance: Normal appearance.  HENT:     Head: Normocephalic and atraumatic.  Eyes:     Conjunctiva/sclera: Conjunctivae normal.  Cardiovascular:     Rate and Rhythm: Normal rate and regular rhythm.  Pulmonary:     Effort: Pulmonary effort is normal.     Breath sounds: Normal breath sounds.  Chest:     Chest wall: No mass.  Breasts:    Right: Normal. No mass, nipple discharge or skin change.     Left: Normal. No mass, nipple discharge or skin change.  Lymphadenopathy:     Upper Body:     Right upper body: No supraclavicular, axillary or pectoral adenopathy.     Left upper body: No supraclavicular, axillary or pectoral adenopathy.  Skin:    General: Skin is warm and dry.  Neurological:     Mental Status: She is alert.  Psychiatric:        Mood and Affect: Mood normal.      No results found for any visits on 09/04/23.    The 10-year ASCVD risk score (Arnett DK, et al., 2019) is: 1.8%    Assessment & Plan:   Problem List Items Addressed This Visit       Endocrine   IFG (impaired fasting glucose)   Relevant Orders   Lipid Panel With LDL/HDL Ratio   CMP14+EGFR   CBC     Nervous and Auditory   Chronic bilateral low back pain with right-sided sciatica   Other Visit Diagnoses       Wellness examination    -  Primary   Relevant Orders   Lipid Panel With LDL/HDL  Ratio   CMP14+EGFR   CBC     Encounter for immunization       Relevant Orders   Tdap vaccine greater than or equal to 7yo IM (Completed)       Return in about 6 months (around 03/03/2024) for Pre-diabetes.    Nani Gasser, MD

## 2023-09-04 NOTE — Progress Notes (Signed)
Complete physical exam  Patient: Kelli Tyler   DOB: [birth year]   58 y.o. Female  MRN: [id number]  Subjective:    Chief Complaint  Patient presents with   Annual Exam    Kelli Tyler is a 58 y.o. female who presents today for a complete physical exam. She reports consuming a general diet. The patient does not participate in regular exercise at present. She generally feels well. She reports sleeping fairly well. She does not have additional problems to discuss today.   She did 1 to let me know that she has been doing compounded Zepbound through an online prescription program.  It was more affordable than going through her regular insurance.  She is down 293 pounds at home and is doing really well with the medication.    Most recent fall risk assessment:    04/29/2023    2:46 PM  Fall Risk   Falls in the past year? 1  Number falls in past yr: 1  Injury with Fall? 1  Risk for fall due to : History of fall(s)  Follow up Falls evaluation completed     Most recent depression screenings:    04/29/2023    2:50 PM 08/06/2022    2:25 PM  PHQ 2/9 Scores  PHQ - 2 Score 1 0  PHQ- 9 Score 3         Patient Care Team: Agapito Games, MD as PCP - General   Outpatient Medications Prior to Visit  Medication Sig   DULoxetine (CYMBALTA) 60 MG capsule TAKE 1 CAPSULE BY MOUTH EVERY DAY   naproxen (NAPROSYN) 500 MG tablet Take 1 tablet (500 mg total) by mouth 2 (two) times daily with a meal.   traMADol (ULTRAM) 50 MG tablet Take 1 tablet (50 mg total) by mouth 3 (three) times daily as needed.   No facility-administered medications prior to visit.    ROS        Objective:     BP 128/75   Pulse 66   Ht 5\' 4"  (1.626 m)   Wt 195 lb (88.5 kg)   LMP 08/07/2015   SpO2 99%   BMI 33.47 kg/m    Physical Exam Exam conducted with a chaperone present.  Constitutional:      Appearance: Normal appearance.  HENT:     Head: Normocephalic and atraumatic.     Right Ear:  Tympanic membrane, ear canal and external ear normal.     Left Ear: Tympanic membrane, ear canal and external ear normal.     Nose: Nose normal.     Mouth/Throat:     Pharynx: Oropharynx is clear.  Eyes:     Extraocular Movements: Extraocular movements intact.     Conjunctiva/sclera: Conjunctivae normal.     Pupils: Pupils are equal, round, and reactive to light.  Neck:     Thyroid: No thyromegaly.  Cardiovascular:     Rate and Rhythm: Normal rate and regular rhythm.  Pulmonary:     Effort: Pulmonary effort is normal.     Breath sounds: Normal breath sounds.  Chest:     Chest wall: No mass.  Breasts:    Right: Normal. No mass, nipple discharge or skin change.     Left: Normal. No mass, nipple discharge or skin change.  Abdominal:     General: Bowel sounds are normal.     Palpations: Abdomen is soft.     Tenderness: There is no abdominal tenderness.  Musculoskeletal:  General: No swelling.     Cervical back: Neck supple.  Lymphadenopathy:     Upper Body:     Right upper body: No supraclavicular, axillary or pectoral adenopathy.     Left upper body: No supraclavicular, axillary or pectoral adenopathy.  Skin:    General: Skin is warm and dry.  Neurological:     Mental Status: She is oriented to person, place, and time.  Psychiatric:        Mood and Affect: Mood normal.        Behavior: Behavior normal.      No results found for any visits on 09/04/23.     Assessment & Plan:    Routine Health Maintenance and Physical Exam  Immunization History  Administered Date(s) Administered   Influenza,inj,Quad PF,6+ Mos 08/28/2015, 07/31/2017, 07/09/2018   Influenza-Unspecified 08/28/2015, 07/31/2017, 07/09/2018   Tdap 08/30/2013, 09/04/2023    Health Maintenance  Topic Date Due   INFLUENZA VACCINE  11/17/2023 (Originally 03/20/2023)   Hepatitis C Screening  04/28/2024 (Originally 07/18/1986)   COVID-19 Vaccine (1) 05/14/2024 (Originally 07/18/1973)   Zoster Vaccines-  Shingrix (1 of 2) 07/28/2024 (Originally 07/19/1987)   MAMMOGRAM  09/11/2024   Cervical Cancer Screening (HPV/Pap Cotest)  06/08/2025   Colonoscopy  09/25/2027   DTaP/Tdap/Td (3 - Td or Tdap) 09/03/2033   HIV Screening  Completed   HPV VACCINES  Aged Out    Discussed health benefits of physical activity, and encouraged her to engage in regular exercise appropriate for her age and condition.  Problem List Items Addressed This Visit       Endocrine   IFG (impaired fasting glucose)   Relevant Orders   Lipid Panel With LDL/HDL Ratio   CMP14+EGFR   CBC     Nervous and Auditory   Chronic bilateral low back pain with right-sided sciatica   Other Visit Diagnoses       Wellness examination    -  Primary   Relevant Orders   Lipid Panel With LDL/HDL Ratio   CMP14+EGFR   CBC     Encounter for immunization       Relevant Orders   Tdap vaccine greater than or equal to 7yo IM (Completed)      TDap given today.  Encouraged her to get the Prevnar 20 unfortunately it was out of stock here in our clinic so hopefully next time we see her we can get her updated.  Pap smear is up-to-date will get updated labs today. Mammogram has already been scheduled.  Return in about 6 months (around 03/03/2024) for Pre-diabetes.     Nani Gasser, MD

## 2023-09-05 LAB — LIPID PANEL WITH LDL/HDL RATIO
Cholesterol, Total: 207 mg/dL — ABNORMAL HIGH (ref 100–199)
HDL: 53 mg/dL (ref >39)
LDL Chol Calc (NIH): 137 mg/dL — ABNORMAL HIGH (ref 0–99)
LDL/HDL Ratio: 2.6 {ratio} (ref 0.0–3.2)
Triglycerides: 97 mg/dL (ref 0–149)
VLDL Cholesterol Cal: 17 mg/dL (ref 5–40)

## 2023-09-05 LAB — CMP14+EGFR
ALT: 16 [IU]/L (ref 0–32)
AST: 16 [IU]/L (ref 0–40)
Albumin: 4.5 g/dL (ref 3.8–4.9)
Alkaline Phosphatase: 139 [IU]/L — ABNORMAL HIGH (ref 44–121)
BUN/Creatinine Ratio: 33 — ABNORMAL HIGH (ref 9–23)
BUN: 18 mg/dL (ref 6–24)
Bilirubin Total: 0.3 mg/dL (ref 0.0–1.2)
CO2: 22 mmol/L (ref 20–29)
Calcium: 9.6 mg/dL (ref 8.7–10.2)
Chloride: 101 mmol/L (ref 96–106)
Creatinine, Ser: 0.55 mg/dL — ABNORMAL LOW (ref 0.57–1.00)
Globulin, Total: 2.4 g/dL (ref 1.5–4.5)
Glucose: 76 mg/dL (ref 70–99)
Potassium: 4.8 mmol/L (ref 3.5–5.2)
Sodium: 139 mmol/L (ref 134–144)
Total Protein: 6.9 g/dL (ref 6.0–8.5)
eGFR: 108 mL/min/{1.73_m2} (ref >59)

## 2023-09-05 LAB — CBC
Hematocrit: 45 % (ref 34.0–46.6)
Hemoglobin: 14.2 g/dL (ref 11.1–15.9)
MCH: 26.8 pg (ref 26.6–33.0)
MCHC: 31.6 g/dL (ref 31.5–35.7)
MCV: 85 fL (ref 79–97)
Platelets: 410 10*3/uL (ref 150–450)
RBC: 5.3 x10E6/uL — ABNORMAL HIGH (ref 3.77–5.28)
RDW: 15.3 % (ref 11.7–15.4)
WBC: 10.5 10*3/uL (ref 3.4–10.8)

## 2023-09-05 NOTE — Progress Notes (Signed)
Hi Kelli Tyler, your total cholesterol and LDL are elevated.  Triglycerides look better this time just continue to work on healthy diet and regular exercise.  The alkaline phosphatase which is type of liver enzyme is just slightly elevated not in a worrisome range but I do want a keep an eye on it and plan to check again in 6 months.  Your additional liver enzymes are normal so again I am not as worried but I do want to follow it.  Your blood count is normal.

## 2023-09-24 ENCOUNTER — Ambulatory Visit: Payer: BC Managed Care – PPO

## 2023-09-24 DIAGNOSIS — Z1231 Encounter for screening mammogram for malignant neoplasm of breast: Secondary | ICD-10-CM

## 2023-09-30 ENCOUNTER — Encounter: Payer: Self-pay | Admitting: Family Medicine

## 2023-09-30 NOTE — Progress Notes (Signed)
Please call patient. Normal mammogram.  Repeat in 1 year.

## 2023-10-08 ENCOUNTER — Other Ambulatory Visit: Payer: Self-pay | Admitting: Sports Medicine

## 2023-10-08 DIAGNOSIS — G8929 Other chronic pain: Secondary | ICD-10-CM

## 2023-10-27 ENCOUNTER — Ambulatory Visit: Payer: BC Managed Care – PPO | Admitting: Family Medicine

## 2024-03-04 ENCOUNTER — Ambulatory Visit: Payer: BC Managed Care – PPO | Admitting: Family Medicine

## 2024-03-04 VITALS — BP 128/72 | HR 75 | Ht 64.0 in | Wt 171.0 lb

## 2024-03-04 DIAGNOSIS — G8929 Other chronic pain: Secondary | ICD-10-CM

## 2024-03-04 DIAGNOSIS — R7301 Impaired fasting glucose: Secondary | ICD-10-CM

## 2024-03-04 LAB — POCT GLYCOSYLATED HEMOGLOBIN (HGB A1C): Hemoglobin A1C: 5.2 % (ref 4.0–5.6)

## 2024-03-04 NOTE — Assessment & Plan Note (Signed)
   Lab Results  Component Value Date   HGBA1C 5.2 03/04/2024   A1c looks absolutely phenomenal today.  She is currently on a GLP-1.  I believe 2.5 mg a tirzepatide but planning on going back to [facility] because it was also a cheaper co-pay for her.  We did discuss getting some updated labs maybe this fall.  Right now she does not have insurance coverage so would prefer to hold off.

## 2024-03-04 NOTE — Progress Notes (Signed)
 Established Patient Office Visit  Subjective  Patient ID: Tyera Kahre, female    DOB: 1966-01-01  Age: 58 y.o. MRN: [id number]  Chief Complaint  Patient presents with   ifg    HPI  Impaired fasting glucose-no increased thirst or urination. No symptoms consistent with hypoglycemia.  Follow-up chronic pain management-she is currently on tramadol  nightly for pain she says since losing weight she is actually done a lot better so she is just been able to take her naproxen  and her tramadol  and her duloxetine  60 mg.  She is happy with her current regimen and mood has been good.  She has been working with a [employer] and is currently on tirzepatide but is thinking about going back to [facility] because it was a little less expensive but she is doing well BMI is down to 29.     ROS    Objective:     BP 128/72   Pulse 75   Ht 5' 4 (1.626 m)   Wt 171 lb (77.6 kg)   LMP 08/07/2015   SpO2 99%   BMI 29.35 kg/m    Physical Exam Vitals and nursing note reviewed.  Constitutional:      Appearance: Normal appearance.  HENT:     Head: Normocephalic and atraumatic.  Eyes:     Conjunctiva/sclera: Conjunctivae normal.  Cardiovascular:     Rate and Rhythm: Normal rate and regular rhythm.  Pulmonary:     Effort: Pulmonary effort is normal.     Breath sounds: Normal breath sounds.  Skin:    General: Skin is warm and dry.  Neurological:     Mental Status: She is alert.  Psychiatric:        Mood and Affect: Mood normal.      Results for orders placed or performed in visit on 03/04/24  POCT HgB A1C  Result Value Ref Range   Hemoglobin A1C 5.2 4.0 - 5.6 %   HbA1c POC (<> result, manual entry)     HbA1c, POC (prediabetic range)     HbA1c, POC (controlled diabetic range)        The 10-year ASCVD risk score (Arnett DK, et al., 2019) is: 2.1%    Assessment & Plan:   Problem List Items Addressed This Visit       Endocrine   IFG (impaired fasting  glucose) - Primary     Lab Results  Component Value Date   HGBA1C 5.2 03/04/2024   A1c looks absolutely phenomenal today.  She is currently on a GLP-1.  I believe 2.5 mg a tirzepatide but planning on going back to [facility] because it was also a cheaper co-pay for her.  We did discuss getting some updated labs maybe this fall.  Right now she does not have insurance coverage so would prefer to hold off.      Relevant Orders   POCT HgB A1C (Completed)     Other   Encounter for chronic pain management   Indication for chronic pain  Medication and dose: 50mg  qd at bedtime, 30mg  # pills per month: 30 Last UDS date:  03/04/24 Opioid Treatment Agreement signed (Y/N): Y NCCSRS reviewed this encounter (include red flags):   Yes   Additional medications: Cymbalta -60 mg.  We also discussed taking 2 extra strength. OK to take ES Tylenol as well. .         Return in about 6 months (around 09/04/2024) for Pre-diabetes, Chronic Pain Mgt.    Dorothyann Byars, MD

## 2024-03-04 NOTE — Assessment & Plan Note (Addendum)
 Indication for chronic pain  Medication and dose: 50mg  qd at bedtime, 30mg  # pills per month: 30 Last UDS date:  03/04/24 Opioid Treatment Agreement signed (Y/N): Y NCCSRS reviewed this encounter (include red flags):   Yes   Additional medications: Cymbalta -60 mg.  We also discussed taking 2 extra strength. OK to take ES Tylenol as well. SABRA

## 2024-04-20 ENCOUNTER — Encounter: Payer: Self-pay | Admitting: Sports Medicine

## 2024-05-05 ENCOUNTER — Other Ambulatory Visit: Payer: Self-pay | Admitting: *Deleted

## 2024-05-05 DIAGNOSIS — G8929 Other chronic pain: Secondary | ICD-10-CM

## 2024-05-05 MED ORDER — DULOXETINE HCL 60 MG PO CPEP
60.0000 mg | ORAL_CAPSULE | Freq: Every day | ORAL | 1 refills | Status: AC
Start: 2024-05-05 — End: ?

## 2024-05-20 ENCOUNTER — Other Ambulatory Visit: Payer: Self-pay | Admitting: *Deleted

## 2024-05-20 DIAGNOSIS — G8929 Other chronic pain: Secondary | ICD-10-CM

## 2024-05-20 MED ORDER — TRAMADOL HCL 50 MG PO TABS: 50.0000 mg | ORAL_TABLET | Freq: Three times a day (TID) | ORAL | 2 refills | Status: DC | PRN

## 2024-05-20 NOTE — Telephone Encounter (Signed)
 Pt was getting this prescribed by Dr. Curtis. Last RF 02/22/2024

## 2024-09-02 ENCOUNTER — Encounter: Payer: Self-pay | Admitting: Family Medicine

## 2024-09-02 ENCOUNTER — Ambulatory Visit: Payer: Self-pay | Admitting: Family Medicine

## 2024-09-02 VITALS — BP 131/77 | HR 64 | Ht 64.0 in | Wt 145.0 lb

## 2024-09-02 DIAGNOSIS — L84 Corns and callosities: Secondary | ICD-10-CM

## 2024-09-02 DIAGNOSIS — G2581 Restless legs syndrome: Secondary | ICD-10-CM | POA: Diagnosis not present

## 2024-09-02 DIAGNOSIS — Z23 Encounter for immunization: Secondary | ICD-10-CM | POA: Diagnosis not present

## 2024-09-02 DIAGNOSIS — M5030 Other cervical disc degeneration, unspecified cervical region: Secondary | ICD-10-CM

## 2024-09-02 DIAGNOSIS — M5441 Lumbago with sciatica, right side: Secondary | ICD-10-CM

## 2024-09-02 DIAGNOSIS — R7301 Impaired fasting glucose: Secondary | ICD-10-CM

## 2024-09-02 DIAGNOSIS — G8929 Other chronic pain: Secondary | ICD-10-CM

## 2024-09-02 LAB — POCT GLYCOSYLATED HEMOGLOBIN (HGB A1C): Hemoglobin A1C: 5.2 % (ref 4.0–5.6)

## 2024-09-02 MED ORDER — NAPROXEN 500 MG PO TABS: 500.0000 mg | ORAL_TABLET | Freq: Two times a day (BID) | ORAL | 3 refills | Status: AC

## 2024-09-02 MED ORDER — TRAMADOL HCL 50 MG PO TABS: 50.0000 mg | ORAL_TABLET | Freq: Three times a day (TID) | ORAL | 2 refills | Status: AC | PRN

## 2024-09-02 NOTE — Assessment & Plan Note (Signed)
 Chronic pain - Continue Tramadol  50 mg oral nightly, Duloxetine  60 mg oral daily, Naproxen  as needed.

## 2024-09-02 NOTE — Progress Notes (Signed)
 "  Established Patient Office Visit  Patient ID: Kelli Tyler, female    DOB: 1966/07/11  Age: 59 y.o. MRN: [id number] PCP: Alvan Dorothyann BIRCH, MD  Chief Complaint  Patient presents with   ifg   Pain Management    Subjective:     HPI  Discussed the use of AI scribe software for clinical note transcription with the patient, who gave verbal consent to proceed.  History of Present Illness Kelli Tyler is a 59 year old female who presents for follow-up and management of her current medications and health status.  Metabolic health and medication management - Current medication regimen includes tirzepatide, tramadol , duloxetine , and naproxen . - Maintains stable weight and expresses satisfaction with current regimen. - Adheres to a healthy diet, including daily consumption of broccoli and salmon. - A1c is 5.2, indicating good glycemic control. - Recent lipid panel: LDL 119, HDL 60, triglycerides within normal range. - Increased energy levels since starting vitamin D supplementation at 25 micrograms daily.  Peripheral neuropathy and foot symptoms - Experiences occasional numbness in the foot, typically after sitting for 30-40 minutes. - Numbness led to a fall last week. - Persistent bothersome spot on the ball of the right foot for 3-4 months. - Corn medicine has been ineffective for the right foot lesion.  Hematologic findings - White blood cell count is mildly elevated at 12.6. - Monocytes and neutrophils are above reference range. - No recent illness or infection.  Restless leg syndrome - Restless leg syndrome is currently well-managed.      ROS    Objective:     BP 131/77   Pulse 64   Ht 5' 4 (1.626 m)   Wt 145 lb (65.8 kg)   LMP 08/07/2015   SpO2 100%   BMI 24.89 kg/m     Physical Exam Vitals and nursing note reviewed.  Constitutional:      Appearance: Normal appearance.  HENT:     Head: Normocephalic and atraumatic.  Eyes:     Conjunctiva/sclera:  Conjunctivae normal.  Cardiovascular:     Rate and Rhythm: Normal rate and regular rhythm.  Pulmonary:     Effort: Pulmonary effort is normal.     Breath sounds: Normal breath sounds.  Skin:    General: Skin is warm and dry.  Neurological:     Mental Status: She is alert.  Psychiatric:        Mood and Affect: Mood normal.      Results for orders placed or performed in visit on 09/02/24  POCT HgB A1C  Result Value Ref Range   Hemoglobin A1C 5.2 4.0 - 5.6 %   HbA1c POC (<> result, manual entry)     HbA1c, POC (prediabetic range)     HbA1c, POC (controlled diabetic range)         The 10-year ASCVD risk score (Arnett DK, et al., 2019) is: 2.5%    Assessment & Plan:   Problem List Items Addressed This Visit       Endocrine   IFG (impaired fasting glucose) - Primary   Impaired fasting glucose A1c at 5.2 indicates good glycemic control. Weight and dietary habits stable. - Continue tirzepatide 2.5 mg subcutaneous. - Encouraged continued healthy diet and weight maintenance.      Relevant Orders   POCT HgB A1C (Completed)     Nervous and Auditory   Chronic bilateral low back pain with right-sided sciatica   Relevant Medications   naproxen  (NAPROSYN ) 500 MG tablet   traMADol  (  ULTRAM ) 50 MG tablet     Musculoskeletal and Integument   DDD (degenerative disc disease), cervical   Chronic pain - Continue Tramadol  50 mg oral nightly, Duloxetine  60 mg oral daily, Naproxen  as needed.      Relevant Medications   naproxen  (NAPROSYN ) 500 MG tablet     Other   RESTLESS LEG SYNDROME   Stable. No changes.       Encounter for chronic pain management   Indication for chronic pain  Medication and dose: 50mg  qd at bedtime, 30mg  # pills per month: 30 Last UDS date:  03/04/24 Opioid Treatment Agreement signed (Y/N): Y [facility] reviewed this encounter (include red flags):   Yes   Additional medications: Cymbalta -60 mg.  We also discussed taking 2 extra strength. OK to take ES  Tylenol as well.         Relevant Orders   Drug Screen, Ur (12+Oxycodone +Crt)   Other Visit Diagnoses       Encounter for immunization       Relevant Orders   Flu vaccine trivalent PF, 6mos and older(Flulaval,Afluria,Fluarix,Fluzone) (Completed)     Corn or callus           Assessment and Plan Assessment & Plan Plantar corn, right foot Plantar corn present for 3-4 months, causing discomfort. Previous topical treatments ineffective. Cryotherapy chosen. - Performed cryotherapy with liquid nitrogen. - Advised soreness, blistering, and peeling in 1.5 to 2 weeks. - Instructed to report if unresolved or further treatment needed.  General health maintenance Discussed vaccinations: flu, pneumonia, shingles. Flu shot administered today. - Consider Prevnar 20 vaccine for pneumococcal pneumonia. - Consider shingles vaccine, second dose in 2-6 months.   Return in about 6 months (around 03/02/2025) for Chronic Pain Mgt, Pre-diabetes.   Cryotherapy Procedure Note  Pre-operative Diagnosis: plantar corn  Post-operative Diagnosis: same  Locations: ball of right foot  Indications: pain  Anesthesia: none  Procedure Details  Patient informed of risks (permanent scarring, infection, light or dark discoloration, bleeding, infection, weakness, numbness and recurrence of the lesion) and benefits of the procedure and verbal informed consent obtained.  The areas are treated with liquid nitrogen therapy, frozen until ice ball extended 1-2 mm beyond lesion, allowed to thaw, and treated again. The patient tolerated procedure well.  The patient was instructed on post-op care, warned that there may be blister formation, redness and pain. Recommend OTC analgesia as needed for pain.  Condition: Stable  Complications: none.  Plan: 1. Instructed to keep the area dry and covered for 24-48h and clean thereafter. 2. Warning signs of infection were reviewed.   3. Recommended that the patient use  OTC acetaminophen as needed for pain.  4. Return PRN.    Dorothyann Byars, MD [facility] at [facility]   "

## 2024-09-02 NOTE — Assessment & Plan Note (Signed)
 Impaired fasting glucose A1c at 5.2 indicates good glycemic control. Weight and dietary habits stable. - Continue tirzepatide 2.5 mg subcutaneous. - Encouraged continued healthy diet and weight maintenance.

## 2024-09-02 NOTE — Assessment & Plan Note (Signed)
 Indication for chronic pain  Medication and dose: 50mg  qd at bedtime, 30mg  # pills per month: 30 Last UDS date:  03/04/24 Opioid Treatment Agreement signed (Y/N): Y [facility] reviewed this encounter (include red flags):   Yes   Additional medications: Cymbalta -60 mg.  We also discussed taking 2 extra strength. OK to take ES Tylenol as well. SABRA

## 2024-09-02 NOTE — Assessment & Plan Note (Signed)
 Stable   No changes

## 2024-09-09 LAB — DRUG SCREEN, UR (12+OXYCODONE+CRT)
Amphetamine Scrn, Ur: NEGATIVE ng/mL
BARBITURATE SCREEN URINE: NEGATIVE ng/mL
BENZODIAZEPINE SCREEN, URINE: NEGATIVE ng/mL
CANNABINOIDS UR QL SCN: NEGATIVE ng/mL
Cocaine (Metab) Scrn, Ur: NEGATIVE ng/mL
Creatinine(Crt), U: 40 mg/dL (ref 20.0–300.0)
Fentanyl, Urine: NEGATIVE pg/mL
Meperidine Screen, Urine: NEGATIVE ng/mL
Methadone Screen, Urine: NEGATIVE ng/mL
OXYCODONE+OXYMORPHONE UR QL SCN: NEGATIVE ng/mL
Opiate Scrn, Ur: NEGATIVE ng/mL
Ph of Urine: 7 (ref 4.5–8.9)
Phencyclidine Qn, Ur: NEGATIVE ng/mL
Propoxyphene Scrn, Ur: NEGATIVE ng/mL
SPECIFIC GRAVITY: 1.006
Tramadol Screen, Urine: POSITIVE — AB

## 2024-09-09 LAB — SPECIMEN STATUS REPORT

## 2025-03-03 ENCOUNTER — Ambulatory Visit: Admitting: Family Medicine
